# Patient Record
Sex: Male | Born: 1980 | Race: White | Hispanic: No | Marital: Married | Smoking: Current some day smoker
Health system: Southern US, Community
[De-identification: ages and names within clinical notes are randomized; demographics above are authoritative.]

---

## 2013-01-17 ENCOUNTER — Ambulatory Visit: Payer: Self-pay | Admitting: Family Medicine

## 2013-02-20 ENCOUNTER — Ambulatory Visit: Payer: Self-pay | Admitting: Family Medicine

## 2015-03-04 IMAGING — CR DG KNEE COMPLETE 4+V*R*
1 series · 4 of 4 positions shown · non-contrast
Comparison: none

REASON FOR EXAM: right knee pain
COMMENTS:

PROCEDURE:     MDR - MDR KNEE RT COMPLETE W/OBLIQUES  - January 17, 2013 [DATE]
RESULT:     Comparison: None.

[Series 1: ap · 0.17mm/px · 4 of 4 slices shown]
[im 1/4]
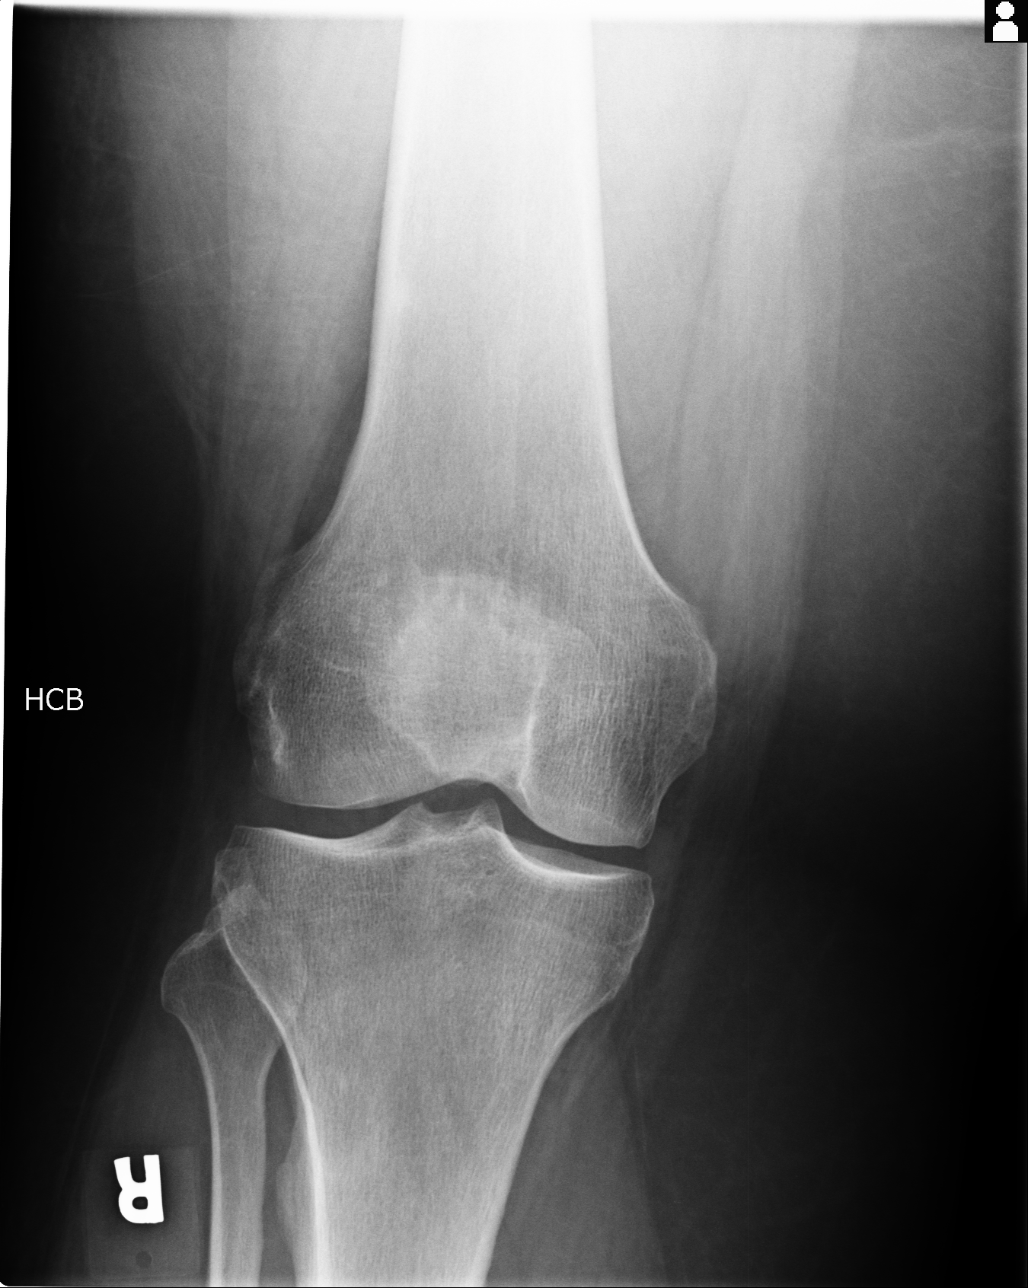
[im 2/4]
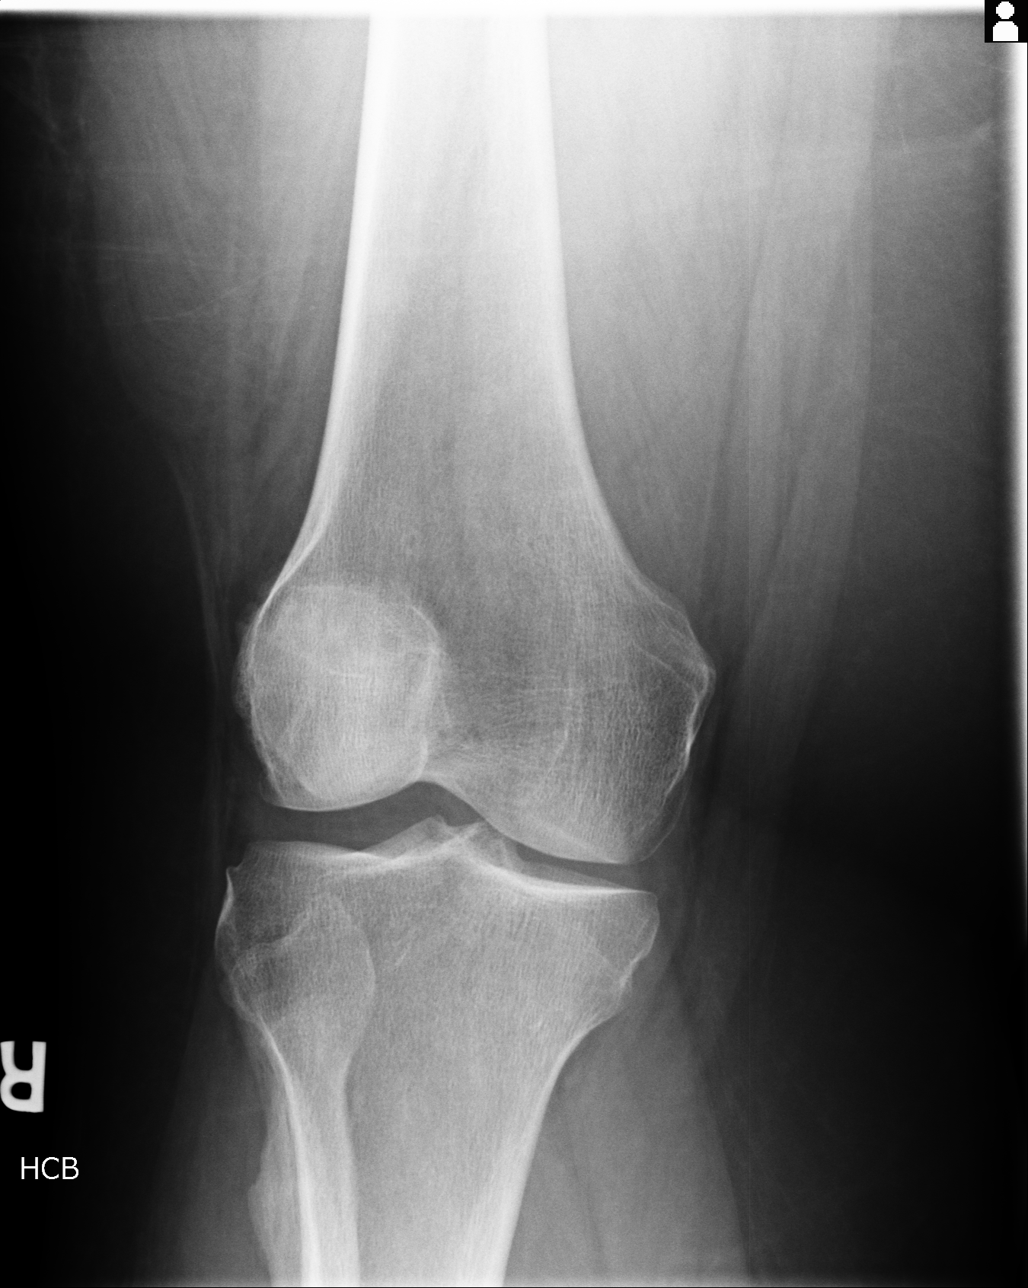
[im 3/4]
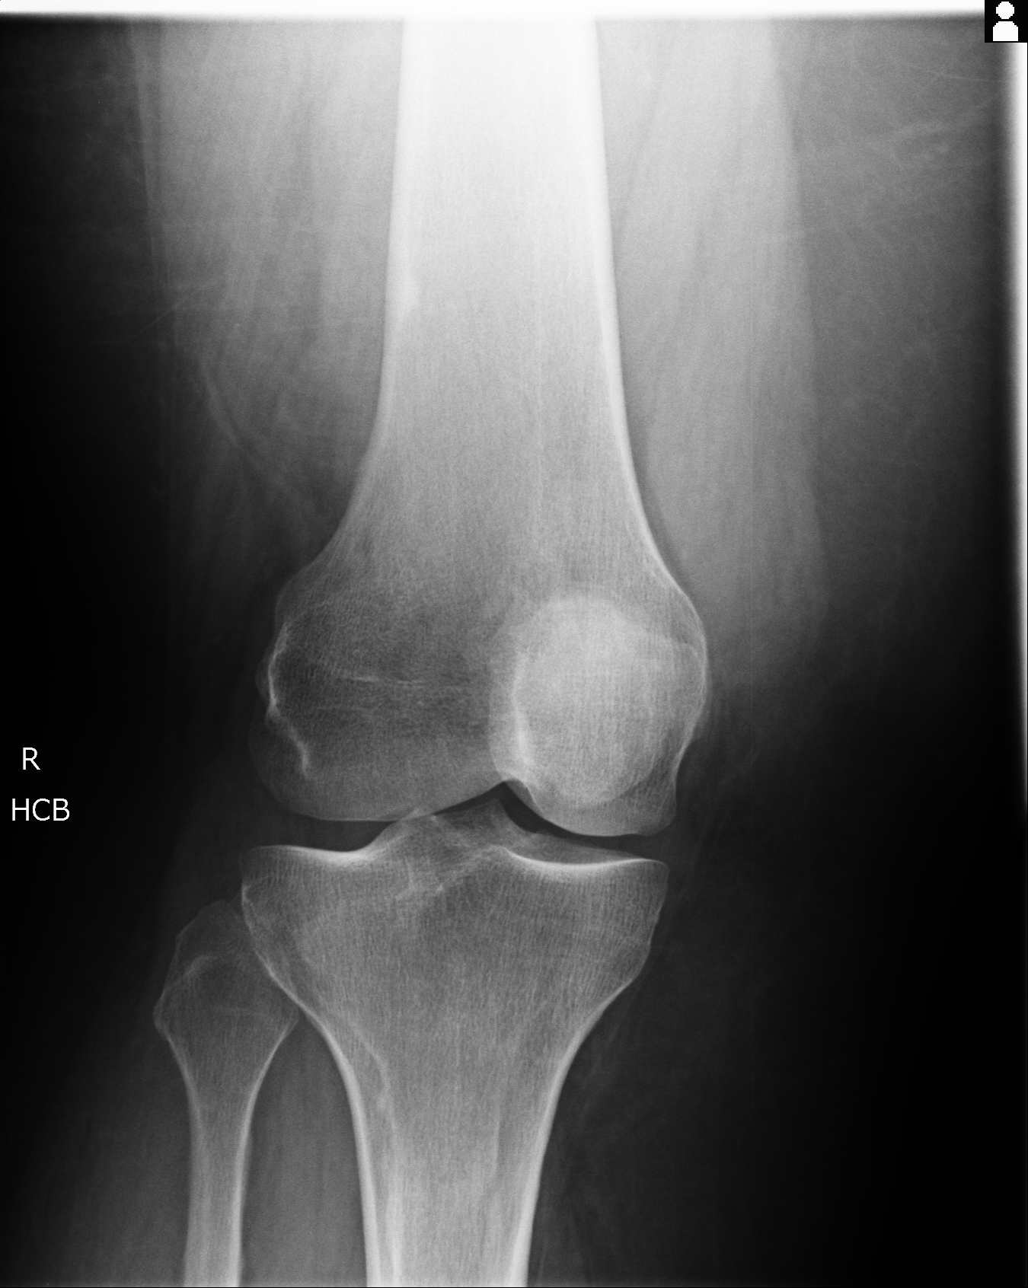
[im 4/4]
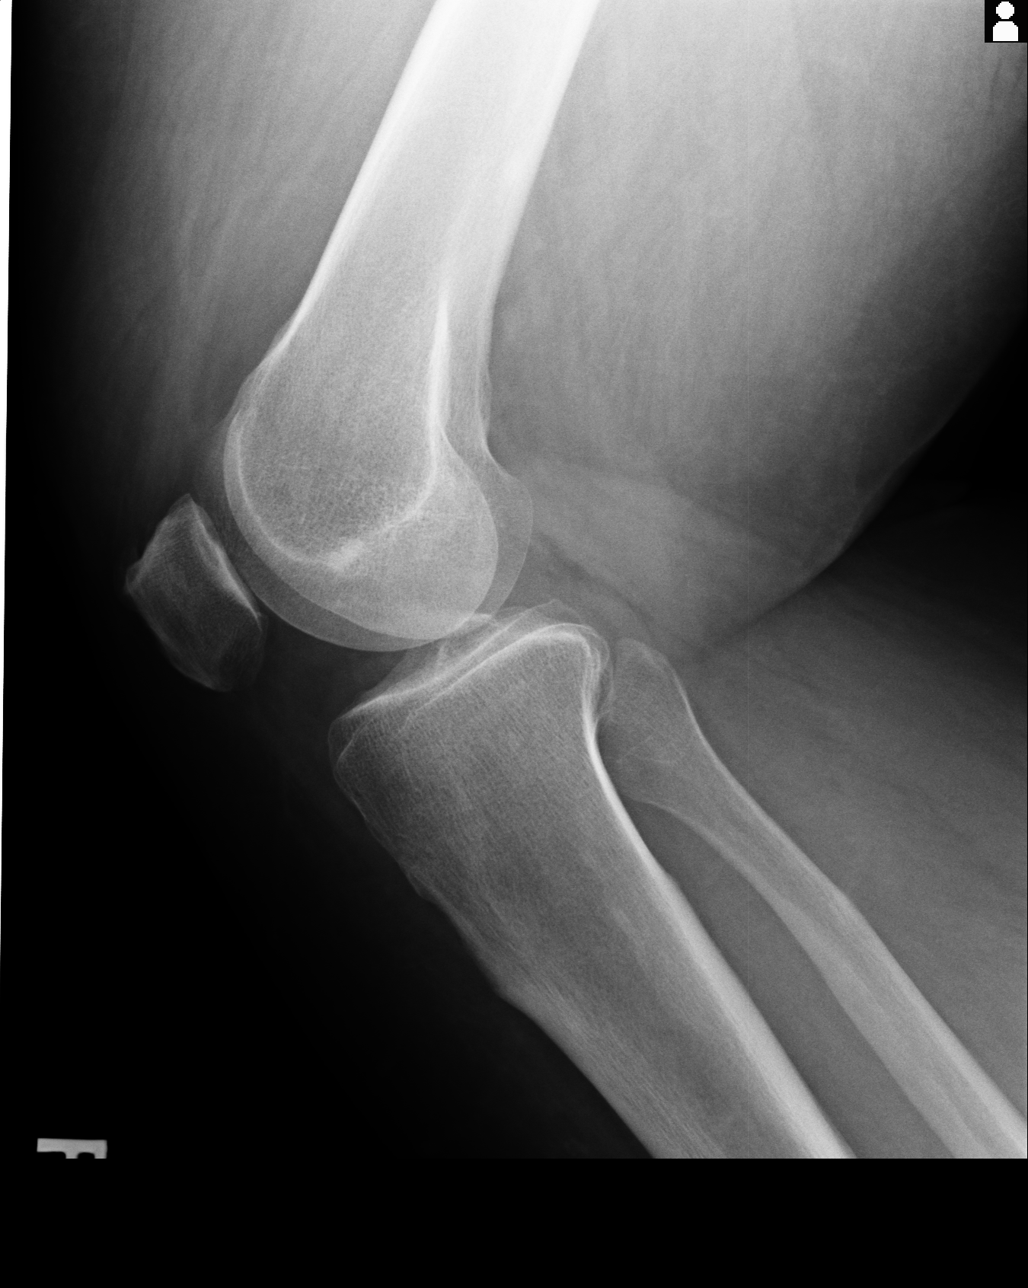

[4 of 4 positions shown; findings below may reference images not displayed]

FINDINGS: No acute fracture. The joint spaces are maintained. No significant effusion.
IMPRESSION: No acute findings.

[REDACTED]

## 2016-12-17 ENCOUNTER — Ambulatory Visit
Admission: EM | Admit: 2016-12-17 | Discharge: 2016-12-17 | Disposition: A | Discharge status: Home / Self Care | Payer: BLUE CROSS/BLUE SHIELD

## 2016-12-17 ENCOUNTER — Emergency Department: Payer: BLUE CROSS/BLUE SHIELD

## 2016-12-17 ENCOUNTER — Emergency Department
Admission: EM | Admit: 2016-12-17 | Discharge: 2016-12-17 | Disposition: A | Discharge status: Home / Self Care | Payer: BLUE CROSS/BLUE SHIELD | Attending: Emergency Medicine | Admitting: Emergency Medicine

## 2016-12-17 ENCOUNTER — Encounter: Payer: Self-pay | Admitting: Emergency Medicine

## 2016-12-17 DIAGNOSIS — R109 Unspecified abdominal pain: Secondary | ICD-10-CM | POA: Diagnosis present

## 2016-12-17 DIAGNOSIS — N200 Calculus of kidney: Secondary | ICD-10-CM | POA: Insufficient documentation

## 2016-12-17 DIAGNOSIS — F17290 Nicotine dependence, other tobacco product, uncomplicated: Secondary | ICD-10-CM | POA: Diagnosis not present

## 2016-12-17 DIAGNOSIS — R739 Hyperglycemia, unspecified: Secondary | ICD-10-CM | POA: Insufficient documentation

## 2016-12-17 LAB — URINALYSIS, COMPLETE (UACMP) WITH MICROSCOPIC
Bacteria, UA: NONE SEEN
Bilirubin Urine: NEGATIVE
Glucose, UA: NEGATIVE mg/dL
KETONES UR: NEGATIVE mg/dL
Leukocytes, UA: NEGATIVE
Nitrite: NEGATIVE
PH: 5 (ref 5.0–8.0)
Protein, ur: 100 mg/dL — AB
Specific Gravity, Urine: 1.026 (ref 1.005–1.030)

## 2016-12-17 LAB — BASIC METABOLIC PANEL
ANION GAP: 10 (ref 5–15)
BUN: 15 mg/dL (ref 6–20)
CALCIUM: 9.2 mg/dL (ref 8.9–10.3)
CO2: 24 mmol/L (ref 22–32)
Chloride: 104 mmol/L (ref 101–111)
Creatinine, Ser: 1 mg/dL (ref 0.61–1.24)
GFR calc Af Amer: >60 mL/min (ref >60)
GFR calc non Af Amer: >60 mL/min (ref >60)
Glucose, Bld: 220 mg/dL — ABNORMAL HIGH (ref 65–99)
POTASSIUM: 4.2 mmol/L (ref 3.5–5.1)
Sodium: 138 mmol/L (ref 135–145)

## 2016-12-17 LAB — CBC WITH DIFFERENTIAL/PLATELET
BASOS ABS: 0.1 10*3/uL (ref 0–0.1)
BASOS PCT: 1 %
Eosinophils Absolute: 0 10*3/uL (ref 0–0.7)
Eosinophils Relative: 0 %
HEMATOCRIT: 48.4 % (ref 40.0–52.0)
Hemoglobin: 16.2 g/dL (ref 13.0–18.0)
Lymphocytes Relative: 10 %
Lymphs Abs: 1.2 10*3/uL (ref 1.0–3.6)
MCH: 29.4 pg (ref 26.0–34.0)
MCHC: 33.5 g/dL (ref 32.0–36.0)
MCV: 87.8 fL (ref 80.0–100.0)
Monocytes Absolute: 0.7 10*3/uL (ref 0.2–1.0)
Monocytes Relative: 6 %
NEUTROS ABS: 9.9 10*3/uL — AB (ref 1.4–6.5)
Neutrophils Relative %: 83 %
Platelets: 185 10*3/uL (ref 150–440)
RBC: 5.51 MIL/uL (ref 4.40–5.90)
RDW: 13.9 % (ref 11.5–14.5)
WBC: 11.9 10*3/uL — ABNORMAL HIGH (ref 3.8–10.6)

## 2016-12-17 MED ORDER — ONDANSETRON HCL 4 MG PO TABS: 4.0000 mg | ORAL_TABLET | Freq: Three times a day (TID) | ORAL | 0 refills | Status: DC | PRN

## 2016-12-17 MED ORDER — KETOROLAC TROMETHAMINE 30 MG/ML IJ SOLN
30.0000 mg | Freq: Once | INTRAMUSCULAR | Status: AC
  Administered 2016-12-17: 30 mg via INTRAVENOUS

## 2016-12-17 MED ORDER — FENTANYL CITRATE (PF) 100 MCG/2ML IJ SOLN
50.0000 ug | Freq: Once | INTRAMUSCULAR | Status: AC
  Administered 2016-12-17: 50 ug via INTRAVENOUS
  Filled 2016-12-17: qty 2

## 2016-12-17 MED ORDER — ONDANSETRON HCL 4 MG/2ML IJ SOLN
4.0000 mg | Freq: Once | INTRAMUSCULAR | Status: AC
  Administered 2016-12-17: 4 mg via INTRAVENOUS
  Filled 2016-12-17: qty 2

## 2016-12-17 MED ORDER — HYDROMORPHONE HCL 1 MG/ML IJ SOLN
1.0000 mg | Freq: Once | INTRAMUSCULAR | Status: AC
  Administered 2016-12-17: 1 mg via INTRAVENOUS
  Filled 2016-12-17: qty 1

## 2016-12-17 MED ORDER — SODIUM CHLORIDE 0.9 % IV BOLUS (SEPSIS)
1000.0000 mL | Freq: Once | INTRAVENOUS | Status: AC
  Administered 2016-12-17: 1000 mL via INTRAVENOUS

## 2016-12-17 MED ORDER — TAMSULOSIN HCL 0.4 MG PO CAPS: 0.4000 mg | ORAL_CAPSULE | Freq: Every day | ORAL | 0 refills | Status: DC

## 2016-12-17 MED ORDER — KETOROLAC TROMETHAMINE 30 MG/ML IJ SOLN
INTRAMUSCULAR | Status: AC
  Administered 2016-12-17: 30 mg via INTRAVENOUS
  Filled 2016-12-17: qty 1

## 2016-12-17 MED ORDER — OXYCODONE-ACETAMINOPHEN 5-325 MG PO TABS: 1.0000 | ORAL_TABLET | Freq: Four times a day (QID) | ORAL | 0 refills | Status: DC | PRN

## 2016-12-17 MED ORDER — IBUPROFEN 200 MG PO TABS: 600.0000 mg | ORAL_TABLET | Freq: Four times a day (QID) | ORAL | 0 refills | Status: DC | PRN

## 2016-12-17 NOTE — ED Provider Notes (Addendum)
[facility] Emergency Department Provider Note  ____________________________________________   I have reviewed the triage vital signs and the nursing notes.   HISTORY  Chief Complaint Flank Pain    HPI Jesse Kelly. is a 36 y.o. male who presents today with sudden onset right flank pain. Patient states the pains been theresince around 11. He has not had a kidney stone before. He denies any other significant medical history. He has no numbness or weakness. States pain is excruciating and he states that it is a sharp pain that radiates to his groin waxes and wanes. Has had nausea and vomiting with the pain only. Sudden onset.     History reviewed. No pertinent past medical history.  There are no active problems to display for this patient.   History reviewed. No pertinent surgical history.  Prior to Admission medications   Not on File    Allergies Patient has no known allergies.  History reviewed. No pertinent family history.  Social History Social History  Substance Use Topics  . Smoking status: Current Some Day Smoker    Types: Cigars  . Smokeless tobacco: Never Used  . Alcohol use No    Review of Systems Constitutional: No fever/chills Eyes: No visual changes. ENT: No sore throat. No stiff neck no neck pain Cardiovascular: Denies chest pain. Respiratory: Denies shortness of breath. Gastrointestinal:   Positive vomiting.  No diarrhea.  No constipation. Genitourinary: Negative for dysuria. Musculoskeletal: Negative lower extremity swelling Skin: Negative for rash. Neurological: Negative for severe headaches, focal weakness or numbness. 10-point ROS otherwise negative.  ____________________________________________   PHYSICAL EXAM:  VITAL SIGNS: ED Triage Vitals  Enc Vitals Group     BP 12/17/16 1403 (!) 162/80     Pulse Rate 12/17/16 1403 (!) 101     Resp 12/17/16 1403 (!) 22     Temp 12/17/16 1403 98.6 F (37 C)      Temp Source 12/17/16 1403 Oral     SpO2 12/17/16 1403 96 %     Weight 12/17/16 1404 (!) 395 lb (179.2 kg)     Height 12/17/16 1404 5\' 11"  (1.803 m)     Head Circumference --      Peak Flow --      Pain Score 12/17/16 1403 10     Pain Loc --      Pain Edu? --      Excl. in [place]? --     Constitutional: Alert and oriented. He is not medically toxic but is very dramatically uncomfortable Eyes: Conjunctivae are normal. PERRL. EOMI. Head: Atraumatic. Nose: No congestion/rhinnorhea. Mouth/Throat: Mucous membranes are moist.  Oropharynx non-erythematous. Neck: No stridor.   Nontender with no meningismus Cardiovascular: Normal rate, regular rhythm. Grossly normal heart sounds.  Good peripheral circulation. Respiratory: Normal respiratory effort.  No retractions. Lungs CTAB. Abdominal: Soft and nontender. No distention. No guarding no rebound Back:  There is no focal tenderness or step off.  there is no midline tenderness there are no lesions noted. there is Positive right CVA tenderness Musculoskeletal: No lower extremity tenderness, no upper extremity tenderness. No joint effusions, no DVT signs strong distal pulses no edema Neurologic:  Normal speech and language. No gross focal neurologic deficits are appreciated.  Skin:  Skin is warm, dry and intact. No rash noted. Psychiatric: Mood and affect are anxious and upset. Speech and behavior are normal.  ____________________________________________   LABS (all labs ordered are listed, but only abnormal results are displayed)  Labs Reviewed  URINALYSIS, COMPLETE (UACMP) WITH MICROSCOPIC - Abnormal; Notable for the following:       Result Value   Color, Urine AMBER (*)    APPearance CLOUDY (*)    Hgb urine dipstick LARGE (*)    Protein, ur 100 (*)    Squamous Epithelial / LPF 0-5 (*)    All other components within normal limits  BASIC METABOLIC PANEL - Abnormal; Notable for the following:    Glucose, Bld 220 (*)    All other components  within normal limits  CBC WITH DIFFERENTIAL/PLATELET - Abnormal; Notable for the following:    WBC 11.9 (*)    Neutro Abs 9.9 (*)    All other components within normal limits   ____________________________________________  EKG  I personally interpreted any EKGs ordered by me or triage  ____________________________________________  RADIOLOGY  I reviewed any imaging ordered by me or triage that were performed during my shift and, if possible, patient and/or family made aware of any abnormal findings. ____________________________________________   PROCEDURES  Procedure(s) performed: None  Procedures  Critical Care performed: None  ____________________________________________   INITIAL IMPRESSION / ASSESSMENT AND PLAN / ED COURSE  Pertinent labs & imaging results that were available during my care of the patient were reviewed by me and considered in my medical decision making (see chart for details).  Patient very anxious and upset, although clinically he appears to be nontoxic. He does have evidence of kidney stone with hematuria. History of physical are very consistent with kidney stone. Patient received fentanyl and "it did not touch his pain" from the waiting room. I will therefore give him Toradol and Dilaudid as he is yelling in the room.  ----------------------------------------- 3:45 PM on 12/17/2016 -----------------------------------------  Patient very comfortable pain is nearly gone, we will recheck his vitals as I assume that will improve now that his pain is better. He is not vomiting. CT scan shows a small stone which is her pastor in the process of passing. Mild hydronephrosis is noted. Nothing to suggest urinary tract infection. Glucose is not dangerously elevated But it is above normal we have made the patient and his family aware that he needs to have that rechecked as an outpatient.  ----------------------------------------- 4:03 PM on  12/17/2016 ----------------------------------------- Patient very comfortable this time, again counseled him about weight loss, his elevated sugar and the need to follow-up with his primary care doctor, we will send him home with the usual medications return precautions and follow-up given and understood.       ____________________________________________   FINAL CLINICAL IMPRESSION(S) / ED DIAGNOSES  Final diagnoses:  None      This chart was dictated using voice recognition software.  Despite best efforts to proofread,  errors can occur which can change meaning.      Schuyler Amor, MD 12/17/16 [place], MD 12/17/16 [phone]

## 2016-12-17 NOTE — ED Triage Notes (Signed)
This RN initially called out to car by patient's father, pt was initially found on the ground outside of the car, patient's father states patient laid down on the ground when he was attempting to get out of the car. Pt c/o R flank pain at this time, states at some points pain is a 7/10 then "jumps up to a 20." Pt is noted to be nauseated and actively vomiting in triage.

## 2016-12-17 NOTE — ED Notes (Signed)
Pt taken to CT by Ebony Hail, Ct tech

## 2019-02-01 IMAGING — CT CT RENAL STONE PROTOCOL
2 of 5 series · 16 of 46 positions shown, 18 images · non-contrast
Comparison: None.

CLINICAL DATA: Acute right flank pain.  Difficulty urinating.

EXAM:
CT ABDOMEN AND PELVIS WITHOUT CONTRAST
TECHNIQUE: Multidetector CT imaging of the abdomen and pelvis was performed
following the standard protocol without IV contrast.

[Series 2: stone full standard · axial · 0.89mm/px · z∈[-556,-76]mm · 13 of 106 slices shown, 15 images]
[im 5/106  soft-tissue]
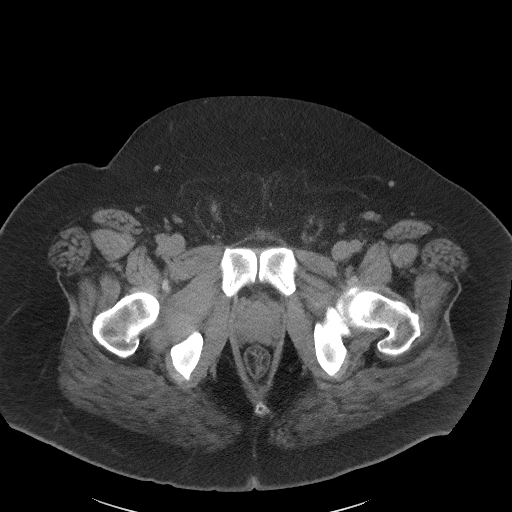
[im 5/106  bone]
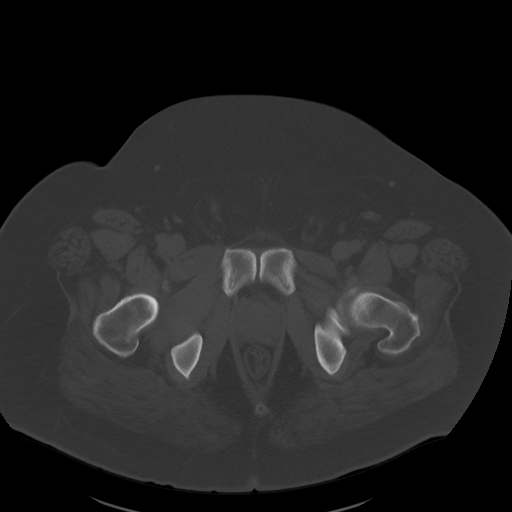
[im 15/106  soft-tissue]
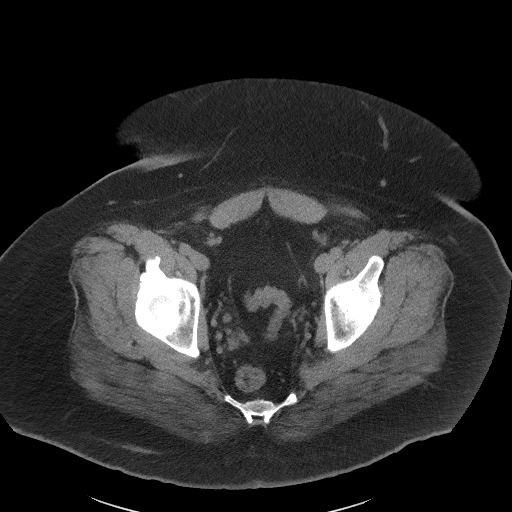
[im 24/106  soft-tissue]
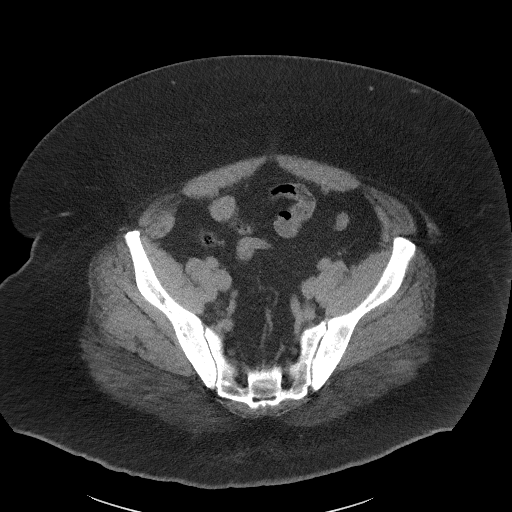
[im 29/106  soft-tissue]
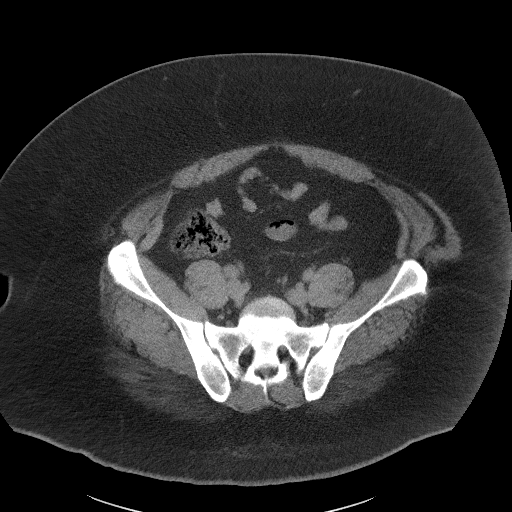
[im 39/106  soft-tissue]
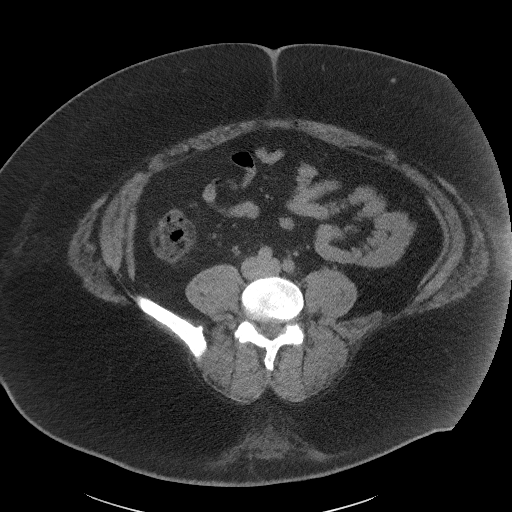
[im 43/106  soft-tissue]
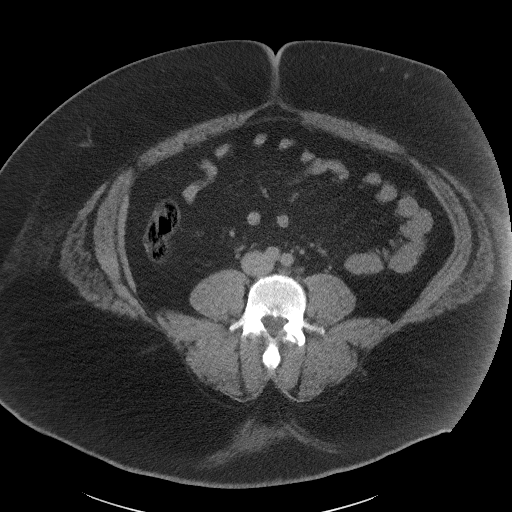
[im 53/106  soft-tissue]
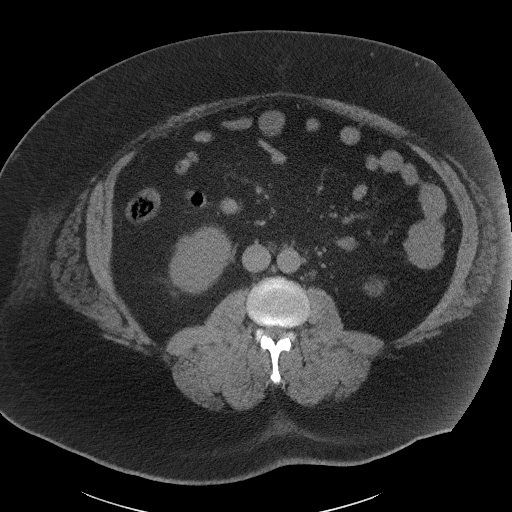
[im 63/106  soft-tissue]
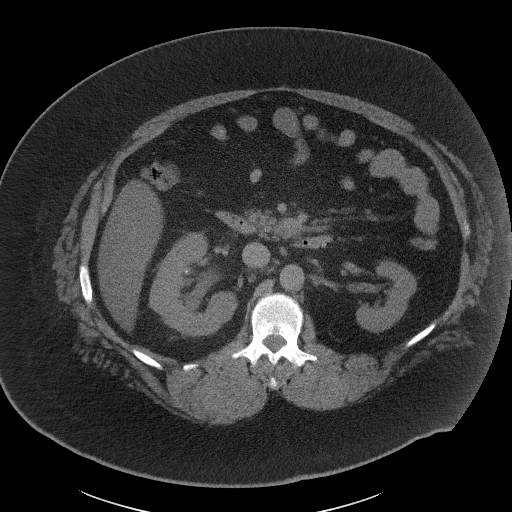
[im 67/106  soft-tissue]
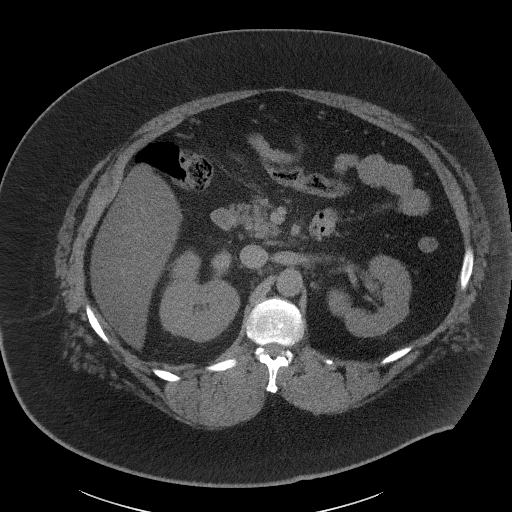
[im 67/106  bone]
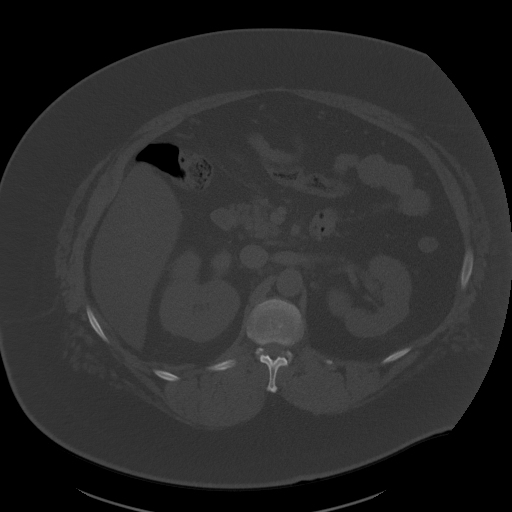
[im 77/106  soft-tissue]
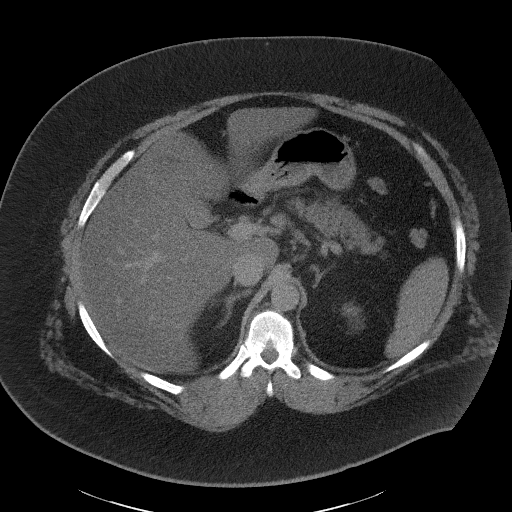
[im 82/106  soft-tissue]
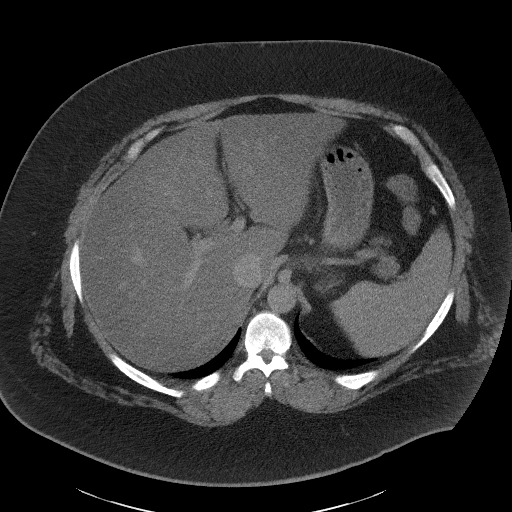
[im 91/106  soft-tissue]
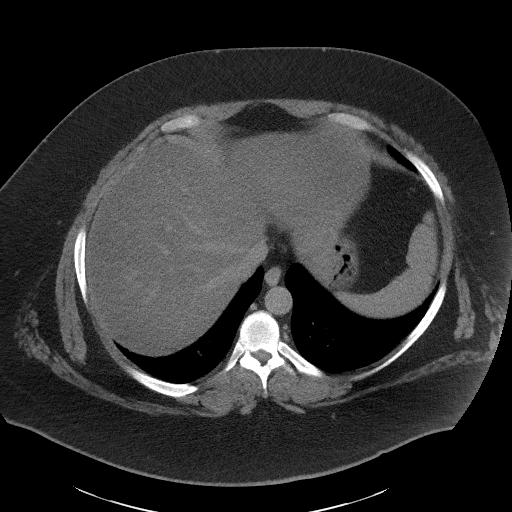
[im 101/106  soft-tissue]
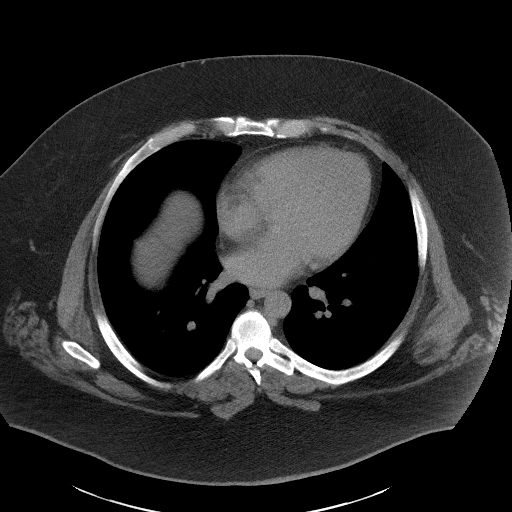

[Series 7: coronal · coronal · 0.89mm/px · 3 of 166 slices shown]
[im 56/166  soft-tissue]
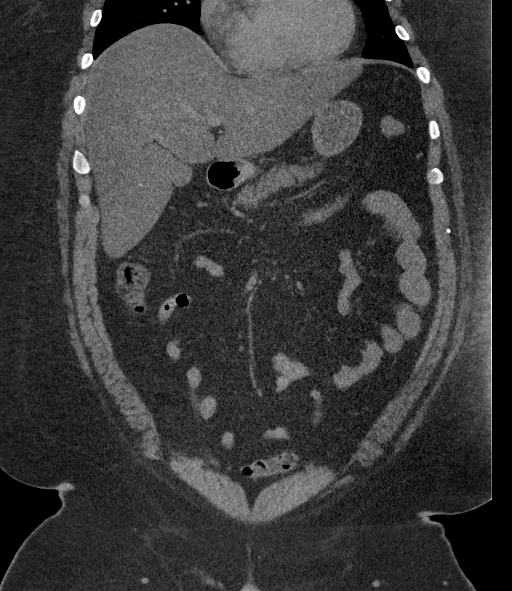
[im 74/166  soft-tissue]
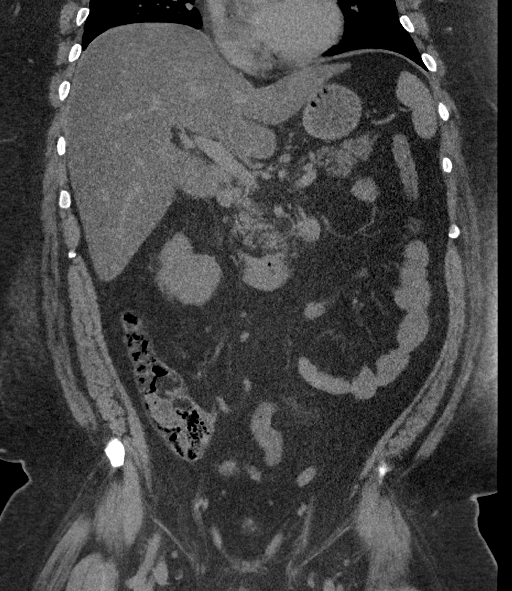
[im 92/166  soft-tissue]
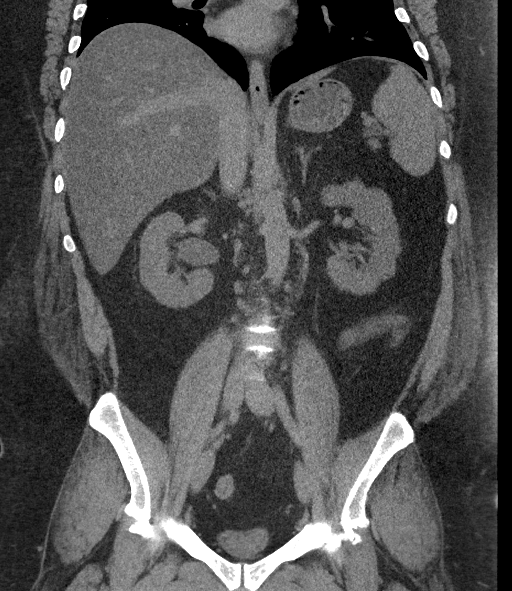

[16 of 46 positions shown; findings below may reference images not displayed]

FINDINGS: Lower chest: No acute abnormality.

Hepatobiliary: Hepatic steatosis. The gallbladder is normally
distended, however demonstrates unusually hyperdense appearance.

Pancreas: Unremarkable. No pancreatic ductal dilatation or
surrounding inflammatory changes.

Spleen: Normal in size without focal abnormality.

Adrenals/Urinary Tract: Normal left kidney and bilateral adrenal
glands. Normal left ureter. There is right perirenal fat stranding,
mild right hydronephrosis and hydroureter, likely caused by a 3 mm
calculus which is seen in the right trigone region of the urinary
bladder. It is not certain whether it is located within the
intravesicular portion of the right ureter or it is free within the
urinary bladder. A tiny 1-2 mm nonobstructive calculus is seen in
the superior pole of the right kidney.

Stomach/Bowel: Stomach is within normal limits. Appendix appears
normal. No evidence of bowel wall thickening, distention, or
inflammatory changes.

Vascular/Lymphatic: No significant vascular findings are present. No
enlarged abdominal or pelvic lymph nodes.

Reproductive: Prostate is unremarkable.

Other: No abdominal wall hernia or abnormality. No abdominopelvic
ascites.

Musculoskeletal: No acute or significant osseous findings.
IMPRESSION: Mild right hydroureter and hydronephrosis with perirenal
inflammatory changes, likely caused by 3 mm calculus which is seen
within the right trigone region of the urinary bladder. It is not
certain whether the calculus is located within the intravesicular
portion of the right ureter or it is free within the urinary
bladder.

Tiny 1-2 mm nonobstructive calculus in the superior pole of the
right kidney.

Hepatic steatosis.

Unusually high attenuation of the contents of the otherwise normally
distended gallbladder. Please correlate clinically.

## 2019-12-25 ENCOUNTER — Other Ambulatory Visit: Payer: Self-pay | Admitting: Family Medicine

## 2020-01-01 ENCOUNTER — Other Ambulatory Visit: Payer: Self-pay | Admitting: Family Medicine

## 2020-01-01 DIAGNOSIS — D171 Benign lipomatous neoplasm of skin and subcutaneous tissue of trunk: Secondary | ICD-10-CM

## 2020-01-23 ENCOUNTER — Ambulatory Visit: Admission: RE | Admit: 2020-01-23 | Payer: Self-pay | Source: Ambulatory Visit

## 2024-03-24 NOTE — Progress Notes (Signed)
 Chief Complaint:   Chief Complaint  Patient presents with   Follow-up    Follow up    Subjective:  Jesse Kelly is a 43 y.o. male in today for follow up.    DM.  He is on Metformin 1000 mg twice a day and Glipizide once a day.  Had been on trulicity weekly but hasn't had any for at least 2-3 months.  He had lost weight on the trulicity but weight is stable in recent months.  BS's have run 140-220 in the AM in recent weeks.  No increased thirst and urination.  Last A1c was 8.5 on 03/17/24, down from 10.5 a few months ago. HTN.  He is on Benicar 40 mg daily.  BP has run around 130-140's/80-90's.  Says when he is not stressed, BP is okay but admits that he is stressed/anxious a lot.  He is not physically active.  No chest pain or palpitations.  He has a history of SOB, which has improved since starting Singulair. 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  He has anxiety frequently as mentioned above.  Hyperlipidemia.  Is on Atorvastatin 20 mg daily.  Lipids on 11/06/23 showed TC of 273, HDL 51, LDL 155 and TG 333 but presumably wasn't taking medication prior to those labs. No side effects on the medication.  Liver enzymes are normal.  Past Medical History:  Diagnosis Date   Arthritis    Back pain    Degenerative joint disease of knee, right    DM (diabetes mellitus) (CMS/HHS-HCC)    Dyshidrotic eczema    Hepatic steatosis 12/24/2019   Kidney stones    Morbid obesity (CMS/HHS-HCC)    Right knee pain    Tobacco dependence 07/27/2014   No past surgical history on file. Family History  Problem Relation Name Age of Onset   COPD Mother     Sleep apnea Mother     Diabetes Mother     High blood pressure (Hypertension) Father     Prostate cancer Father     Gout Father     Hyperlipidemia (Elevated cholesterol) Father     Deep vein thrombosis (DVT or abnormal blood clot formation) Maternal Grandmother      Alzheimer's disease Maternal Grandfather     Colon polyps Maternal Grandfather     Colon cancer Maternal Grandfather     Osteoarthritis Paternal Grandmother     Colon cancer Paternal Grandfather     Social History   Socioeconomic History   Marital status: Married   Number of children: 0   Years of education: 14  Tobacco Use   Smoking status: Some Days    Types: Cigars   Smokeless tobacco: Never   Tobacco comments:    1 cigar 1 day a week  Vaping Use   Vaping status: Never Used  Substance and Sexual Activity   Alcohol use: Not Currently    Alcohol/week: 1.0 - 2.0 standard drink of alcohol    Types: 1 - 2 Standard drinks or equivalent per week   Drug use: No   Sexual activity: Yes    Partners: Female  Social History Narrative   Originally from [place] , married without children. He works as a Engineer, materials. He keeps his gun locked ina safe place. He does not wear seatbelts 100 percent.  No weekly exercise. No fast food and unknown amount of red meat weekly.   Social Drivers of Dispensing Optician  Resource Strain: Low Risk  (07/30/2023)   Overall Financial Resource Strain (CARDIA)    Difficulty of Paying Living Expenses: Not hard at all  Food Insecurity: No Food Insecurity (07/30/2023)   Hunger Vital Sign    Worried About Running Out of Food in the Last Year: Never true    Ran Out of Food in the Last Year: Never true  Transportation Needs: No Transportation Needs (07/30/2023)   PRAPARE - Administrator, Civil Service (Medical): No    Lack of Transportation (Non-Medical): No  Housing Stability: Unknown (03/24/2024)   Housing Stability Vital Sign    Number of Times Moved in the Last Year: 0    Homeless in the Last Year: No   Outpatient Medications Prior to Visit  Medication Sig Dispense Refill   albuterol MDI, PROVENTIL, VENTOLIN, PROAIR, HFA 90 mcg/actuation inhaler Inhale 2 inhalations into the lungs 4 (four) times daily 3 each 1    armodafiniL (NUVIGIL) 150 mg tablet Take 1 tablet (150 mg total) by mouth once daily 90 tablet 1   atorvastatin (LIPITOR) 20 MG tablet Take 1 tablet (20 mg total) by mouth at bedtime 90 tablet 1   dulaglutide (TRULICITY) 3 mg/0.5 mL subcutaneous pen injector Inject 0.5 mLs (3 mg total) subcutaneously every 7 (seven) days 2 mL 0   fluticasone-umeclidinium-vilanterol (TRELEGY ELLIPTA) 100-62.5-25 mcg inhaler Inhale 1 Puff into the lungs once daily 1 each 6   glipiZIDE (GLUCOTROL XL) 5 MG XL tablet Take 1 tablet (5 mg total) by mouth once daily 30 tablet 2   metFORMIN (GLUCOPHAGE) 1000 MG tablet TAKE 1 TABLET BY MOUTH TWICE A DAY WITH MEALS 60 tablet 2   montelukast (SINGULAIR) 10 mg tablet Take 1 tablet (10 mg total) by mouth at bedtime 90 tablet 1   olmesartan (BENICAR) 40 MG tablet Take 1 tablet (40 mg total) by mouth once daily 90 tablet 1   No facility-administered medications prior to visit.   Allergies  Allergen Reactions   Iodinated Contrast Media Hives   Dog Dander Other (See Comments)    Wheezing, sob, and  cough.   Dog Epithelium Allergenic Extract Other (See Comments)    Wheezing, sob, and  cough.    Objective:   Vitals:   03/24/24 1524  BP: 136/82  Pulse: 88  SpO2: 98%  Weight: (!) 151.8 kg (334 lb 9.6 oz)  Height: 177.8 cm (5' 10)  PainSc: 0-No pain   Body mass index is 48.01 kg/m.   GENERAL:  Patient is alert and oriented, in no acute distress.  HEENT:   Conjunctivae are noninjected.  Oropharynx:  Moist mucous membranes.  No erythema  or lesions. RESPIRATORY:  Normal inspiratory and expiratory effort.  Lungs are clear to auscultation bilaterally. CARDIOVASCULAR:  Regular rate and rhythm, S1, S2, without murmur, rub, or gallop. ABDOMEN:  Obese, nontender and non-distended.   EXTREMITIES:  +2 pulses bilaterally.  No edema.  Assessment/Plan:  1.  DM.  Restart Trulicity at 1.5 mg weekly.  Continue on current oral meds and work on weight loss and increased  exercise.  He will see if he can get a CGM.  Check A1c and Met B in 3 months. 2.  HTN.  Stay on Olmesartan daily.  Check BP at home and let me know if consistently >130/80. 3.  Hyperlipidemia.  Stay on Atorvastatin 20 mg daily.  Check lipids and liver next labs. 4.  Shift worker syndrome.  Stay on Nuvigil 150 mg daily.   5.  Anxiety, stress.  Consider medication if it significantly is impacting his quality of life. 6.  Follow up in 3 months for recheck, sooner as needed for acute concerns.   Alm Na, MD, PhD

## 2024-08-07 ENCOUNTER — Other Ambulatory Visit: Payer: Self-pay

## 2024-08-07 ENCOUNTER — Telehealth (HOSPITAL_COMMUNITY): Payer: Self-pay | Admitting: Pharmacy Technician

## 2024-08-07 ENCOUNTER — Other Ambulatory Visit (HOSPITAL_COMMUNITY): Payer: Self-pay

## 2024-08-07 ENCOUNTER — Observation Stay
Admission: EM | Admit: 2024-08-07 | Discharge: 2024-08-08 | Disposition: A | Attending: Internal Medicine | Admitting: Internal Medicine

## 2024-08-07 DIAGNOSIS — F17210 Nicotine dependence, cigarettes, uncomplicated: Secondary | ICD-10-CM | POA: Insufficient documentation

## 2024-08-07 DIAGNOSIS — I10 Essential (primary) hypertension: Secondary | ICD-10-CM | POA: Diagnosis not present

## 2024-08-07 DIAGNOSIS — E1110 Type 2 diabetes mellitus with ketoacidosis without coma: Principal | ICD-10-CM

## 2024-08-07 DIAGNOSIS — Z794 Long term (current) use of insulin: Secondary | ICD-10-CM | POA: Insufficient documentation

## 2024-08-07 DIAGNOSIS — E785 Hyperlipidemia, unspecified: Secondary | ICD-10-CM | POA: Insufficient documentation

## 2024-08-07 DIAGNOSIS — Z79899 Other long term (current) drug therapy: Secondary | ICD-10-CM | POA: Insufficient documentation

## 2024-08-07 DIAGNOSIS — E0810 Diabetes mellitus due to underlying condition with ketoacidosis without coma: Secondary | ICD-10-CM

## 2024-08-07 DIAGNOSIS — E1165 Type 2 diabetes mellitus with hyperglycemia: Principal | ICD-10-CM

## 2024-08-07 DIAGNOSIS — R739 Hyperglycemia, unspecified: Secondary | ICD-10-CM | POA: Diagnosis present

## 2024-08-07 DIAGNOSIS — Z6841 Body Mass Index (BMI) 40.0 and over, adult: Secondary | ICD-10-CM | POA: Diagnosis not present

## 2024-08-07 DIAGNOSIS — N179 Acute kidney failure, unspecified: Secondary | ICD-10-CM | POA: Diagnosis not present

## 2024-08-07 DIAGNOSIS — E66813 Obesity, class 3: Secondary | ICD-10-CM

## 2024-08-07 LAB — BASIC METABOLIC PANEL WITH GFR
Anion gap: 19 — ABNORMAL HIGH (ref 5–15)
Anion gap: 13 (ref 5–15)
Anion gap: 13 (ref 5–15)
BUN: 35 mg/dL — ABNORMAL HIGH (ref 6–20)
BUN: 32 mg/dL — ABNORMAL HIGH (ref 6–20)
BUN: 28 mg/dL — ABNORMAL HIGH (ref 6–20)
CO2: 20 mmol/L — ABNORMAL LOW (ref 22–32)
CO2: 26 mmol/L (ref 22–32)
CO2: 25 mmol/L (ref 22–32)
Calcium: 9.6 mg/dL (ref 8.9–10.3)
Calcium: 8.8 mg/dL — ABNORMAL LOW (ref 8.9–10.3)
Calcium: 9 mg/dL (ref 8.9–10.3)
Chloride: 96 mmol/L — ABNORMAL LOW (ref 98–111)
Chloride: 100 mmol/L (ref 98–111)
Chloride: 101 mmol/L (ref 98–111)
Creatinine, Ser: 2.34 mg/dL — ABNORMAL HIGH (ref 0.61–1.24)
Creatinine, Ser: 1.98 mg/dL — ABNORMAL HIGH (ref 0.61–1.24)
Creatinine, Ser: 1.69 mg/dL — ABNORMAL HIGH (ref 0.61–1.24)
GFR, Estimated: 34 mL/min — ABNORMAL LOW (ref >60)
GFR, Estimated: 42 mL/min — ABNORMAL LOW (ref >60)
GFR, Estimated: 50 mL/min — ABNORMAL LOW (ref >60)
Glucose, Bld: 355 mg/dL — ABNORMAL HIGH (ref 70–99)
Glucose, Bld: 236 mg/dL — ABNORMAL HIGH (ref 70–99)
Glucose, Bld: 131 mg/dL — ABNORMAL HIGH (ref 70–99)
Potassium: 3.7 mmol/L (ref 3.5–5.1)
Potassium: 3.5 mmol/L (ref 3.5–5.1)
Potassium: 3.3 mmol/L — ABNORMAL LOW (ref 3.5–5.1)
Sodium: 135 mmol/L (ref 135–145)
Sodium: 138 mmol/L (ref 135–145)
Sodium: 139 mmol/L (ref 135–145)

## 2024-08-07 LAB — CBG MONITORING, ED
Glucose-Capillary: 357 mg/dL — ABNORMAL HIGH (ref 70–99)
Glucose-Capillary: 326 mg/dL — ABNORMAL HIGH (ref 70–99)
Glucose-Capillary: 282 mg/dL — ABNORMAL HIGH (ref 70–99)
Glucose-Capillary: 246 mg/dL — ABNORMAL HIGH (ref 70–99)
Glucose-Capillary: 172 mg/dL — ABNORMAL HIGH (ref 70–99)
Glucose-Capillary: 139 mg/dL — ABNORMAL HIGH (ref 70–99)
Glucose-Capillary: 142 mg/dL — ABNORMAL HIGH (ref 70–99)
Glucose-Capillary: 133 mg/dL — ABNORMAL HIGH (ref 70–99)
Glucose-Capillary: 171 mg/dL — ABNORMAL HIGH (ref 70–99)
Glucose-Capillary: 163 mg/dL — ABNORMAL HIGH (ref 70–99)

## 2024-08-07 LAB — URINALYSIS, ROUTINE W REFLEX MICROSCOPIC
Bilirubin Urine: NEGATIVE
Glucose, UA: >=500 mg/dL — AB
Ketones, ur: 5 mg/dL — AB
Leukocytes,Ua: NEGATIVE
Nitrite: NEGATIVE
Protein, ur: 30 mg/dL — AB
Specific Gravity, Urine: 1.009 (ref 1.005–1.030)
pH: 5 (ref 5.0–8.0)

## 2024-08-07 LAB — OSMOLALITY: Osmolality: 305 mosm/kg — ABNORMAL HIGH (ref 275–295)

## 2024-08-07 LAB — BLOOD GAS, VENOUS
Acid-Base Excess: 5.5 mmol/L — ABNORMAL HIGH (ref 0.0–2.0)
Bicarbonate: 32.4 mmol/L — ABNORMAL HIGH (ref 20.0–28.0)
O2 Saturation: 36.8 %
Patient temperature: 37
pCO2, Ven: 56 mmHg (ref 44–60)
pH, Ven: 7.37 (ref 7.25–7.43)

## 2024-08-07 LAB — CBC
HCT: 50.5 % (ref 39.0–52.0)
Hemoglobin: 17.6 g/dL — ABNORMAL HIGH (ref 13.0–17.0)
MCH: 29.5 pg (ref 26.0–34.0)
MCHC: 34.9 g/dL (ref 30.0–36.0)
MCV: 84.7 fL (ref 80.0–100.0)
Platelets: 176 K/uL (ref 150–400)
RBC: 5.96 MIL/uL — ABNORMAL HIGH (ref 4.22–5.81)
RDW: 12.8 % (ref 11.5–15.5)
WBC: 9.7 K/uL (ref 4.0–10.5)
nRBC: 0 % (ref 0.0–0.2)

## 2024-08-07 LAB — BETA-HYDROXYBUTYRIC ACID
Beta-Hydroxybutyric Acid: 1.35 mmol/L — ABNORMAL HIGH (ref 0.05–0.27)
Beta-Hydroxybutyric Acid: 0.1 mmol/L (ref 0.05–0.27)

## 2024-08-07 LAB — TROPONIN T, HIGH SENSITIVITY: Troponin T High Sensitivity: <15 ng/L (ref 0–19)

## 2024-08-07 MED ORDER — ENOXAPARIN SODIUM 80 MG/0.8ML IJ SOSY
0.5000 mg/kg | PREFILLED_SYRINGE | INTRAMUSCULAR | Status: DC
  Filled 2024-08-07 (×2): qty 0.75

## 2024-08-07 MED ORDER — ACETAMINOPHEN 325 MG PO TABS: 650.0000 mg | ORAL_TABLET | Freq: Four times a day (QID) | ORAL | Status: DC | PRN

## 2024-08-07 MED ORDER — INSULIN GLARGINE 100 UNIT/ML ~~LOC~~ SOLN
10.0000 [IU] | Freq: Every day | SUBCUTANEOUS | Status: DC
  Administered 2024-08-07: 15:00:00 10 [IU] via SUBCUTANEOUS
  Filled 2024-08-07 (×2): qty 0.1

## 2024-08-07 MED ORDER — ONDANSETRON HCL 4 MG/2ML IJ SOLN: 4.0000 mg | Freq: Four times a day (QID) | INTRAMUSCULAR | Status: DC | PRN

## 2024-08-07 MED ORDER — ACETAMINOPHEN 650 MG RE SUPP: 650.0000 mg | Freq: Four times a day (QID) | RECTAL | Status: DC | PRN

## 2024-08-07 MED ORDER — SODIUM CHLORIDE 0.9 % IV BOLUS
1500.0000 mL | Freq: Once | INTRAVENOUS | Status: AC
  Administered 2024-08-07: 10:00:00 1500 mL via INTRAVENOUS

## 2024-08-07 MED ORDER — NICOTINE 14 MG/24HR TD PT24
14.0000 mg | MEDICATED_PATCH | Freq: Every day | TRANSDERMAL | Status: DC
  Filled 2024-08-07: qty 1

## 2024-08-07 MED ORDER — DEXTROSE IN LACTATED RINGERS 5 % IV SOLN: INTRAVENOUS | Status: DC

## 2024-08-07 MED ORDER — HYDRALAZINE HCL 20 MG/ML IJ SOLN: 5.0000 mg | Freq: Four times a day (QID) | INTRAMUSCULAR | Status: DC | PRN

## 2024-08-07 MED ORDER — MELATONIN 5 MG PO TABS: 5.0000 mg | ORAL_TABLET | Freq: Every evening | ORAL | Status: DC | PRN

## 2024-08-07 MED ORDER — ALUM & MAG HYDROXIDE-SIMETH 200-200-20 MG/5ML PO SUSP: 30.0000 mL | Freq: Four times a day (QID) | ORAL | Status: DC | PRN

## 2024-08-07 MED ORDER — INSULIN REGULAR(HUMAN) IN NACL 100-0.9 UT/100ML-% IV SOLN
INTRAVENOUS | Status: DC
  Administered 2024-08-07: 12:00:00 12 [IU]/h via INTRAVENOUS
  Administered 2024-08-07: 14:00:00 2.8 [IU]/h via INTRAVENOUS
  Filled 2024-08-07: qty 100

## 2024-08-07 MED ORDER — INSULIN ASPART 100 UNIT/ML IJ SOLN: 0.0000 [IU] | Freq: Every day | INTRAMUSCULAR | Status: DC

## 2024-08-07 MED ORDER — DEXTROSE 50 % IV SOLN: 0.0000 mL | INTRAVENOUS | Status: DC | PRN

## 2024-08-07 MED ORDER — POTASSIUM CHLORIDE 10 MEQ/100ML IV SOLN
10.0000 meq | INTRAVENOUS | Status: AC
  Administered 2024-08-07 (×2): 10 meq via INTRAVENOUS
  Filled 2024-08-07: qty 100

## 2024-08-07 MED ORDER — ONDANSETRON HCL 4 MG PO TABS: 4.0000 mg | ORAL_TABLET | Freq: Four times a day (QID) | ORAL | Status: DC | PRN

## 2024-08-07 MED ORDER — LACTATED RINGERS IV BOLUS
20.0000 mL/kg | Freq: Once | INTRAVENOUS | Status: AC
  Administered 2024-08-07: 12:00:00 2994 mL via INTRAVENOUS

## 2024-08-07 MED ORDER — ALUM & MAG HYDROXIDE-SIMETH 200-200-20 MG/5ML PO SUSP
30.0000 mL | Freq: Once | ORAL | Status: AC
  Administered 2024-08-07: 16:00:00 30 mL via ORAL
  Filled 2024-08-07: qty 30

## 2024-08-07 MED ORDER — INSULIN ASPART 100 UNIT/ML IJ SOLN
0.0000 [IU] | Freq: Three times a day (TID) | INTRAMUSCULAR | Status: DC
  Administered 2024-08-07: 17:00:00 1 [IU] via SUBCUTANEOUS
  Administered 2024-08-08: 2 [IU] via SUBCUTANEOUS
  Filled 2024-08-07: qty 1
  Filled 2024-08-07: qty 2

## 2024-08-07 MED ORDER — ENOXAPARIN SODIUM 40 MG/0.4ML IJ SOSY: 40.0000 mg | PREFILLED_SYRINGE | INTRAMUSCULAR | Status: DC

## 2024-08-07 MED ORDER — LACTATED RINGERS IV SOLN: INTRAVENOUS | Status: DC

## 2024-08-07 MED ORDER — POLYETHYLENE GLYCOL 3350 17 G PO PACK: 17.0000 g | PACK | Freq: Every day | ORAL | Status: DC | PRN

## 2024-08-07 NOTE — Inpatient Diabetes Management (Addendum)
 Inpatient Diabetes Program Recommendations  AACE/ADA: New Consensus Statement on Inpatient Glycemic Control   Target Ranges:  Prepandial:   less than 140 mg/dL      Peak postprandial:   less than 180 mg/dL (1-2 hours)      Critically ill patients:  140 - 180 mg/dL    Latest Reference Range & Units 08/07/24 08:31 08/07/24 09:51 08/07/24 11:21 08/07/24 12:27  Glucose-Capillary 70 - 99 mg/dL 642 (H) 673 (H) 717 (H) 246 (H)    Latest Reference Range & Units 08/07/24 08:26  Beta-Hydroxybutyric Acid 0.05 - 0.27 mmol/L 1.35 (H)    Latest Reference Range & Units 08/07/24 08:26 08/07/24 12:07  CO2 22 - 32 mmol/L 20 (L) 26  Glucose 70 - 99 mg/dL 644 (H) 763 (H)  BUN 6 - 20 mg/dL 35 (H) 32 (H)  Creatinine 0.61 - 1.24 mg/dL 7.65 (H) 8.01 (H)  Calcium 8.9 - 10.3 mg/dL 9.6 8.8 (L)  Anion gap 5 - 15  19 (H) 13    Recommendation:   Outpatient DM: Per outpatient TOC pharmacy check, Basaglar  and Novolog  insulin  pens are preferred and Basaglar  Pen copay is $25.00, novolog  Flexpen copay is $25.00, and Dexcom copay is $35.00.  At discharge if patient is discharged new to insulin , he will need Rx for Basaglar  pens (#[id number]), Novolog  pens (#[id number]), and insulin  pen needles [phone]).  NOTE: Spoke with patient at bedside regarding DM and insulin . Patient states that he has been taking Glipizide and Metformin for DM and that he was taking Trulicity but has been out of it for over a month. Patient states that every month in order to get the Trulicity filled, he has to deal with the pharmacy and his PCP office multiple times and has to go in for a PCP visit just to get the paperwork filled out to try to get it filled each month. So he is very frustrated with the effort and time it takes to try to get it filled each month. Patient is using Dexcom G7 CGM and current CGM glucose was 199 during conversation. Patient states that his PCP has mentioned to him that he may need to be started on insulin  and he is willing to  take insulin  if needed. Discussed DKA and hospital protocol to treat with IV fluids and IV insulin  then transition to SQ insulin . Discussed that he may need long and short acting insulin  and patient states he would prefer to have both so one will keep glucose better controlled and the other could get his glucose down quickly if he is running high.  Educated patient on insulin  pen use at home. Reviewed all steps of insulin  pen including attachment of needle, 2-unit air shot, dialing up dose, giving injection, removing needle, disposal of sharps, storage of unused insulin , disposal of insulin  etc. Patient able to provide successful return demonstration and states it is very similar to using the Trulicity pen so he feels comfortable with insulin  pen if he is discharged home on insulin .  Informed patient that I would have our TOC pharmacy to check and see which insulins are covered and what copay would be; will also check to see if there is a GLP-1 medication that is preferred with his insurance plan. Stressed importance of getting DM controlled and keeping it controlled to prevent complications from uncontrolled DM. Patient appreciative of information and visit and has no questions at this time.  Thanks, Earnie Gainer, RN, MSN, CDCES Diabetes Coordinator Inpatient Diabetes Program [phone] (Team Pager from  8am to 5pm)

## 2024-08-07 NOTE — ED Notes (Addendum)
 Pt arrives to ED with c/o hyperglycemia. Pt reports that around 2:30 this morning, he started feeling bad and checked his dexcom and his CBG was around 300. Pt reports having increased thirst, sweating and feeling shaky. Pt reports that he does not feel well and feels like everything is a fog. Pt c/o  headache and rates pain 7/10. Pt reports feeling SOB and WOB is increased. Pt A&Ox4.

## 2024-08-07 NOTE — Hospital Course (Addendum)
 Jesse Kelly. is a 43 y.o. year old male with past medical history of type 2 diabetes mellitus, hypertension, hyperlipidemia, and morbid obesity.  He presents to [facility] ED with several days of polyuria and polydipsia.  He reports earlier this morning having a feeling of being foggy headed and notes this happened once in the past when his blood sugar was high.  He denies recent infection/illness, fever.  He reports overall compliance with his metformin  and glipizide , but notes that he stopped Trulicity approximately 1 month ago due to insurance coverage issues.  Reports that he wears a continuous glucose monitor and he has noticed in the last couple days his blood sugar has elevated up to the 340-380 range when they typically run between 130 -50.   ED Course: On arrival to [facility] regional ED patient was noted to be afebrile temp 37.1 C, BP 174/102, HR 116, RR 20, SpO2 94% on room air.  Labs notable for Glucose 345, CO2 20, BUN 35, creatinine 2.34, and anion gap of 19.  Osmolality 305 and beta hydroxybutyric acid 1.35 with pH of 7.37 on VBG.  He was started on IV insulin  via Endo tool for DKA and ordered 20 mEq of IV K. TRH contacted for admission.

## 2024-08-07 NOTE — Inpatient Diabetes Management (Signed)
 Inpatient Diabetes Program Recommendations  AACE/ADA: New Consensus Statement on Inpatient Glycemic Control (2015)  Target Ranges:  Prepandial:   less than 140 mg/dL      Peak postprandial:   less than 180 mg/dL (1-2 hours)      Critically ill patients:  140 - 180 mg/dL   Lab Results  Component Value Date   GLUCAP 326 (H) 08/07/2024    Review of Glycemic Control  Latest Reference Range & Units 08/07/24 08:31 08/07/24 09:51  Glucose-Capillary 70 - 99 mg/dL 642 (H) 673 (H)   Diabetes history: DM 2 (last A1C was 8.5%) Outpatient Diabetes medications:  Metformin 1000 mg bid, Glipizide 5 mg XL daily Current orders for Inpatient glycemic control:  None yet  Inpatient Diabetes Program Recommendations:    Spoke to patient by phone regarding increases in blood sugars last night and overnight.  He states that he was drinking lots of water and took his oral medications.  Of note, patient has not taken Trulicity in over a month due to problems with insurance. He states that blood sugars have never gone up like this and he feels really poorly. Talked to patient regarding potential need for insulin  at discharge.  He is agreeable and states that his MD has told him in the past that he may need insulin .  Per ED MD, Dr. Dicky, patient will be admitted.  Will follow-up on insulin  teaching once admitted.  He does wear Dexcom sensor and also has an understanding of injections from taking the Trulicity.  Will follow.   Thanks,  Randall Bullocks, RN, BC-ADM Inpatient Diabetes Coordinator Pager [phone]  (8a-5p)

## 2024-08-07 NOTE — ED Triage Notes (Signed)
 Pt to ED for hyperglycemia, states does not feel well. Reports CBG in 300s. Takes metformin

## 2024-08-07 NOTE — ED Notes (Signed)
 Called CCMD to add pt to monitoring.

## 2024-08-07 NOTE — ED Notes (Signed)
 Called lab to add Troponin to blood work already sent and was told it would be added.

## 2024-08-07 NOTE — Assessment & Plan Note (Signed)
-   Hold home Benicar - IV fluid hydration -Avoid nephrotoxins, contrast Dyes, Hypotension and Dehydration  - Repeat metabolic panel with a.m. labs

## 2024-08-07 NOTE — Telephone Encounter (Signed)
 Patient Product/process Development Scientist completed.    The patient is insured through [employer]. Patient has [employer], may use a copay card, and/or apply for patient assistance if available.    Ran test claim for Basaglar  Kwikpen and the current 30 day co-pay is $25.00.  Ran test claim for Novolog  Flexpen and the current 30 day co-pay is $25.00.  Ran test claim for Dexcom G7 Sensors and the current 30 day co-pay is $35.00.  Ran test claim for [employer] 3 Plus Sensors and Non Formulary   This test claim was processed through [employer]- copay amounts may vary at other pharmacies due to [employer], or as the patient moves through the different stages of their insurance plan.     Reyes Sharps, CPHT Pharmacy Technician Patient Advocate Specialist Lead [facility] Patient Advocate Team Direct Number: [phone]  Fax: [phone]

## 2024-08-07 NOTE — ED Provider Notes (Signed)
 [facility] Provider Note    Event Date/Time   First MD Initiated Contact with Patient 08/07/24 [phone]     (approximate)   History   Hyperglycemia   HPI  Jesse Kelly. is a 43 y.o. male  Type 2 diabetic, per Dr. Glover last note Metformin 1000 mg twice a day and Glipizide once a day. -At that time plan was to start Trulicity  Over the last several days had frequent urination, then in the last 2 days feeling frequently if thirsty.  Today started to feel little bit slightly foggy headed feeling headache.  Reports this happen once in the past when his blood sugar was out of control.  Feels very similar.  No other recent or preceding illnesses no fevers.  He reports he has been drinking water work last night, ate steak when he got home and asparagus, he has not had anything to eat or drink this morning to take his medicine today but reports compliance with his metformin and glipizide and Trulicity.  Wears a glucose monitor and he has noticed in the last couple days his blood sugar has elevated up to the 340-380 range along with the symptoms.       Physical Exam   Triage Vital Signs: ED Triage Vitals  Encounter Vitals Group     BP 08/07/24 0825 (!) 174/102     Girls Systolic BP Percentile --      Girls Diastolic BP Percentile --      Boys Systolic BP Percentile --      Boys Diastolic BP Percentile --      Pulse Rate 08/07/24 0824 (!) 116     Resp 08/07/24 0824 20     Temp 08/07/24 0824 98.8 F (37.1 C)     Temp src --      SpO2 08/07/24 0825 94 %     Weight 08/07/24 0824 (!) 330 lb (149.7 kg)     Height 08/07/24 0824 5' 11 (1.803 m)     Head Circumference --      Peak Flow --      Pain Score 08/07/24 0824 0     Pain Loc --      Pain Education --      Exclude from Growth Chart --     Most recent vital signs: Vitals:   08/07/24 0900 08/07/24 0930  BP: (!) 162/110 (!) 149/103  Pulse: (!) 110 100  Resp: 17 16  Temp:    SpO2: 100%  100%     General: Awake, no distress.  Pleasant, appears fatigued though.  Mucous membranes are dry.  Fully alert well-oriented.  Reports headache, he reports having same type of headache when his blood sugar was up once in the past as well.  He also feels occasionally a foggy type feeling.  Presently alert and well-oriented CV:  Good peripheral perfusion.  Normal tone except slight tachycardia Resp:  Normal effort.  Clear Abd:  No distention.  Obese but nontender Other:  Findings in the lower extremities of mild venous congestive type skin findings but no unilateral edema, no skin breakdown no evidence of cellulitic change.  Denies any pain or burning with urination but reports frequent urination   ED Results / Procedures / Treatments   Labs (all labs ordered are listed, but only abnormal results are displayed) Labs Reviewed  BASIC METABOLIC PANEL WITH GFR - Abnormal; Notable for the following components:      Result Value  Chloride 96 (*)    CO2 20 (*)    Glucose, Bld 355 (*)    BUN 35 (*)    Creatinine, Ser 2.34 (*)    GFR, Estimated 34 (*)    Anion gap 19 (*)    All other components within normal limits  CBC - Abnormal; Notable for the following components:   RBC 5.96 (*)    Hemoglobin 17.6 (*)    All other components within normal limits  OSMOLALITY - Abnormal; Notable for the following components:   Osmolality 305 (*)    All other components within normal limits  BETA-HYDROXYBUTYRIC ACID - Abnormal; Notable for the following components:   Beta-Hydroxybutyric Acid 1.35 (*)    All other components within normal limits  BLOOD GAS, VENOUS - Abnormal; Notable for the following components:   Bicarbonate 32.4 (*)    Acid-Base Excess 5.5 (*)    All other components within normal limits  CBG MONITORING, ED - Abnormal; Notable for the following components:   Glucose-Capillary 357 (*)    All other components within normal limits  CBG MONITORING, ED - Abnormal; Notable for the  following components:   Glucose-Capillary 326 (*)    All other components within normal limits  URINALYSIS, ROUTINE W REFLEX MICROSCOPIC  BASIC METABOLIC PANEL WITH GFR  BASIC METABOLIC PANEL WITH GFR  BASIC METABOLIC PANEL WITH GFR  BASIC METABOLIC PANEL WITH GFR  BETA-HYDROXYBUTYRIC ACID  BETA-HYDROXYBUTYRIC ACID  BETA-HYDROXYBUTYRIC ACID  BETA-HYDROXYBUTYRIC ACID  CBG MONITORING, ED  CBG MONITORING, ED  CBG MONITORING, ED  CBG MONITORING, ED  CBG MONITORING, ED  CBG MONITORING, ED  CBG MONITORING, ED  CBG MONITORING, ED  CBG MONITORING, ED  CBG MONITORING, ED    RADIOLOGY     PROCEDURES:  Critical Care performed: Yes, see critical care procedure note(s)  CRITICAL CARE Performed by: Oneil Budge   Total critical care time: 35 minutes  Critical care time was exclusive of separately billable procedures and treating other patients.  Critical care was necessary to treat or prevent imminent or life-threatening deterioration.  Critical care was time spent personally by me on the following activities: development of treatment plan with patient and/or surrogate as well as nursing, discussions with consultants, evaluation of patient's response to treatment, examination of patient, obtaining history from patient or surrogate, ordering and performing treatments and interventions, ordering and review of laboratory studies, ordering and review of radiographic studies, pulse oximetry and re-evaluation of patient's condition.   Procedures   MEDICATIONS ORDERED IN ED: Medications  lactated ringers  bolus 2,994 mL (has no administration in time range)  insulin  regular, human (MYXREDLIN ) 100 units/ 100 mL infusion (has no administration in time range)  lactated ringers  infusion (has no administration in time range)  dextrose  5 % in lactated ringers  infusion (has no administration in time range)  dextrose  50 % solution 0-50 mL (has no administration in time range)  potassium  chloride 10 mEq in 100 mL IVPB (has no administration in time range)  sodium chloride  0.9 % bolus 1,500 mL (1,500 mLs Intravenous New Bag/Given 08/07/24 0954)     IMPRESSION / MDM / ASSESSMENT AND PLAN / ED COURSE  I reviewed the triage vital signs and the nursing notes.                              Differential diagnosis includes, but is not limited to, hyperglycemia, he is a type II diabetic does report  a at least associated headache polyuria polydipsia.  Reports compliance with medications.  Neurologically intact without acute deficits moves all extremities extraocular movements are normal facial expressions speech clear speech he reports same headache in the past once when his blood sugar was high.  I suspect he is symptomatic of hypoglycemia he reports that he had sort of foggy type feeling associated as well earlier.  He is now alert and well-oriented.  With his blood sugar in the 360-ish range it seems unlikely he would have HHS or DKA, but we will certainly await screening labs and panel.  Serum awesome's etc.  No history of congestive heart failure.  Will initiate IV fluids, hydration, follow glucoses await chemistry panel.  Have also placed consult to diabetes coordinator for additional assistance if we are able to get his blood sugars well-controlled and his metabolic findings are reassuring with improvement in symptoms may be able to go home with adjustment to his regimen.  At this point he does not have any symptoms suggestive of another disease or process that caused his hyperglycemia and he reports compliance    Patient's presentation is most consistent with acute presentation with potential threat to life or bodily function.   The patient is on the cardiac monitor to evaluate for evidence of arrhythmia and/or significant heart rate changes.   Clinical Course as of 08/07/24 1042  Thu Aug 07, 2024  1027 Labs reviewed elevated anion gap with elevated beta hydroxy.  His pH is  currently normal which is somewhat reassuring but findings are concerning for acidosis and early DKA.  Consult with our hospitalist Dr. Paudel, and we will initiate Endo tool/insulin  drip.  Patient also with significant AKI receiving hydration.  Will continue to be monitored closely and obviously require admission for treatment of metabolic abnormalities, AKI, hyperglycemia etc. [MQ]  1040 I consulted with hospitalist, patient accepted.  Patient understanding agreeable with plan for admission.  He reports a headache and feeling of fogginess are improving quite a bit at this time.  Initiating insulin  drip as recommended by hospitalist service will continue to manage moving forward [MQ]    Clinical Course User Index [MQ] Dicky Anes, MD     FINAL CLINICAL IMPRESSION(S) / ED DIAGNOSES   Final diagnoses:  Type 2 diabetes mellitus with hyperglycemia, without long-term current use of insulin  (HCC)  DKA, type 2, not at goal [facility])  AKI (acute kidney injury)     Rx / DC Orders   ED Discharge Orders     None        Note:  This document was prepared using Dragon voice recognition software and may include unintentional dictation errors.   Dicky Anes, MD 08/07/24 [phone]

## 2024-08-07 NOTE — Assessment & Plan Note (Signed)
-   Hold home Benicar in setting of AKI -As needed hydralazine

## 2024-08-07 NOTE — H&P (Addendum)
 History and Physical    Jesse Kelly. FMW:[id number] DOB: 29-Jan-1981 DOA: 08/07/2024  DOS: the patient was seen and examined on 08/07/2024  PCP: Jesse Idelia DELENA, Jesse Kelly   Patient coming from: Home  I have personally briefly reviewed patient's old medical records in [facility] and CareEverywhere  HPI:  Jesse Piechota. is a 43 y.o. year old male with past medical history of type 2 diabetes mellitus, hypertension, hyperlipidemia, and morbid obesity.  He presents to [facility] ED with several days of polyuria and polydipsia.  He reports earlier this morning having a feeling of being foggy headed and notes this happened once in the past when his blood sugar was high.  He denies recent infection/illness, fever.  He reports overall compliance with his metformin and glipizide, but notes that he stopped Trulicity approximately 1 month ago due to insurance coverage issues.  Reports that he wears a continuous glucose monitor and he has noticed in the last couple days his blood sugar has elevated up to the 340-380 range when they typically run between 130 -50.   ED Course: On arrival to [facility] ED patient was noted to be afebrile temp 37.1 C, BP 174/102, HR 116, RR 20, SpO2 94% on room air.  Labs notable for Glucose 345, CO2 20, BUN 35, creatinine 2.34, and anion gap of 19.  Osmolality 305 and beta hydroxybutyric acid 1.35 with pH of 7.37 on VBG.  He was started on IV insulin  via Endo tool for DKA and ordered 20 mEq of IV K. TRH contacted for admission.    Review of Systems:   Review of Systems  Constitutional:  Negative for chills, fever and malaise/fatigue.  HENT:  Negative for congestion.   Eyes:  Positive for blurred vision. Negative for pain.  Respiratory:  Negative for cough and shortness of breath.   Cardiovascular:  Negative for chest pain and palpitations.  Gastrointestinal:  Negative for abdominal pain, nausea and vomiting.  Genitourinary:  Positive  for frequency.  Neurological:  Positive for headaches.  Endo/Heme/Allergies:  Positive for polydipsia.  Psychiatric/Behavioral:  The patient is nervous/anxious.     Past Medical History:  Diagnosis Date   Diabetes mellitus without complication (HCC)    Hyperlipidemia    Hypertension    Morbid obesity (HCC)     History reviewed. No pertinent surgical history.   reports that he has been smoking cigars. He has never used smokeless tobacco. He reports that he does not drink alcohol and does not use drugs.  Allergies[1]  History reviewed. No pertinent family history.  Prior to Admission medications  Medication Sig Start Date End Date Taking? Authorizing Provider  ibuprofen  (MOTRIN  IB) 200 MG tablet Take 3 tablets (600 mg total) by mouth every 6 (six) hours as needed. 12/17/16   Jesse Lynwood DELENA, Jesse Kelly  ondansetron  (ZOFRAN ) 4 MG tablet Take 1 tablet (4 mg total) by mouth every 8 (eight) hours as needed for nausea or vomiting. 12/17/16   Jesse Lynwood DELENA, Jesse Kelly  oxyCODONE -acetaminophen  (ROXICET) 5-325 MG tablet Take 1 tablet by mouth every 6 (six) hours as needed. 12/17/16   Jesse Lynwood DELENA, Jesse Kelly  tamsulosin  (FLOMAX ) 0.4 MG CAPS capsule Take 1 capsule (0.4 mg total) by mouth daily. 12/17/16   Jesse Lynwood DELENA, Jesse Kelly    Physical Exam: Vitals:   08/07/24 0930 08/07/24 1110 08/07/24 1135 08/07/24 1213  BP: (!) 149/103 (!) 179/105 (!) 172/94   Pulse: 100 (!) 101 89   Resp: 16  17 20   Temp:    98.2 F (36.8 C)  TempSrc:    Oral  SpO2: 100% 99% 100%   Weight:      Height:        Physical Exam Vitals and nursing note reviewed.  HENT:     Head: Normocephalic.  Eyes:     Pupils: Pupils are equal, round, and reactive to light.  Cardiovascular:     Rate and Rhythm: Normal rate and regular rhythm.     Pulses: Normal pulses.     Heart sounds: Normal heart sounds.  Pulmonary:     Effort: Pulmonary effort is normal.     Breath sounds: Normal breath sounds.  Abdominal:     Palpations: Abdomen is  soft.     Tenderness: There is no abdominal tenderness. There is no guarding.  Skin:    General: Skin is warm and dry.     Capillary Refill: Capillary refill takes less than 2 seconds.  Neurological:     General: No focal deficit present.     Mental Status: He is alert.  Psychiatric:        Mood and Affect: Mood normal.        Behavior: Behavior normal.     Labs on Admission: I have personally reviewed following labs and imaging studies  CBC: Recent Labs  Lab 08/07/24 0826  WBC 9.7  HGB 17.6*  HCT 50.5  MCV 84.7  PLT 176   Basic Metabolic Panel: Recent Labs  Lab 08/07/24 0826  NA 135  K 3.7  CL 96*  CO2 20*  GLUCOSE 355*  BUN 35*  CREATININE 2.34*  CALCIUM 9.6   GFR: Estimated Creatinine Clearance: 59.3 mL/min (A) (by C-G formula based on SCr of 2.34 mg/dL (H)). Liver Function Tests: No results for input(s): AST, ALT, ALKPHOS, BILITOT, PROT, ALBUMIN in the last 168 hours. No results for input(s): LIPASE, AMYLASE in the last 168 hours. No results for input(s): AMMONIA in the last 168 hours. Coagulation Profile: No results for input(s): INR, PROTIME in the last 168 hours. Cardiac Enzymes: No results for input(s): CKTOTAL, CKMB, CKMBINDEX, TROPONINI, TROPONINIHS in the last 168 hours. BNP (last 3 results) No results for input(s): BNP in the last 8760 hours. HbA1C: No results for input(s): HGBA1C in the last 72 hours. CBG: Recent Labs  Lab 08/07/24 0831 08/07/24 0951 08/07/24 1121 08/07/24 1227  GLUCAP 357* 326* 282* 246*   Lipid Profile: No results for input(s): CHOL, HDL, LDLCALC, TRIG, CHOLHDL, LDLDIRECT in the last 72 hours. Thyroid Function Tests: No results for input(s): TSH, T4TOTAL, FREET4, T3FREE, THYROIDAB in the last 72 hours. Anemia Panel: No results for input(s): VITAMINB12, FOLATE, FERRITIN, TIBC, IRON, RETICCTPCT in the last 72 hours. Urine analysis:    Component Value  Date/Time   COLORURINE AMBER (A) 12/17/2016 1418   APPEARANCEUR CLOUDY (A) 12/17/2016 1418   LABSPEC 1.026 12/17/2016 1418   PHURINE 5.0 12/17/2016 1418   GLUCOSEU NEGATIVE 12/17/2016 1418   HGBUR LARGE (A) 12/17/2016 1418   BILIRUBINUR NEGATIVE 12/17/2016 1418   KETONESUR NEGATIVE 12/17/2016 1418   PROTEINUR 100 (A) 12/17/2016 1418   NITRITE NEGATIVE 12/17/2016 1418   LEUKOCYTESUR NEGATIVE 12/17/2016 1418    Radiological Exams on Admission: I have personally reviewed images No results found.    Assessment/Plan Principal Problem:   DKA (diabetic ketoacidosis) (HCC) Active Problems:   AKI (acute kidney injury)   Essential hypertension   Hyperlipidemia   Obesity, Class III, BMI 40-49.9 (morbid obesity) (  HCC)    Assessment and Plan: * DKA (diabetic ketoacidosis) (HCC) Glucose 355, pH 7.37 on VBG, and Beta-hydroxybutryic acid 1.35 concerning for early DKA - IV Insulin  per Endo tool - CBG as directed per Endo tool - Trend BMP q4H and Beta-hydroxybutyric acid q8H - Replete K PRN - Consult Diabetes Coordinator   AKI (acute kidney injury) - Hold home Benicar - IV fluid hydration -Avoid nephrotoxins, contrast Dyes, Hypotension and Dehydration  - Repeat metabolic panel with a.m. labs   Essential hypertension - Hold home Benicar in setting of AKI -As needed hydralazine   Hyperlipidemia - Hold home atorvastatin in setting of AKI and likely DKA associated volume depletion  Obesity, Class III, BMI 40-49.9 (morbid obesity) (HCC) Meets criteria for morbid obesity based on the presence of 1 or more chronic comorbidities.  Patient has type 2 diabetes mellitus. This complicates overall care and prognosis.  - Patient counseled on importance of lifestyle changes and healthy diet  Addendum Patient complained of some chest discomfort.  Will get EKG, troponin, give him Maalox and check BNP.  VTE prophylaxis:  Lovenox   GI prophylaxis: Not indicated  Diet: NPO Access: PIV Lines:  None Code Status:  Full Code Telemetry: Yes, Stepdown status  Admission status: Observation, Step Down Unit Patient is from: Home  Anticipated d/c is to: Home  Anticipated d/c date is: 1 day Patient currently: Receiving IV insulin  pending improvement in beta-hydroxybutryic acid and anion gap   Family Communication: Wife updated at bedside  Consults called: N/A    Severity of Illness: The appropriate patient status for this patient is OBSERVATION. Observation status is judged to be reasonable and necessary in order to provide the required intensity of service to ensure the patient's safety. The patient's presenting symptoms, physical exam findings, and initial radiographic and laboratory data in the context of their medical condition is felt to place them at decreased risk for further clinical deterioration. Furthermore, it is anticipated that the patient will be medically stable for discharge from the hospital within 2 midnights of admission.   To reach the provider On-Call:   7AM- 7PM see care teams to locate the attending and reach out to them via [email or web address] Password: [email or web address] 7PM-7AM contact night-coverage If you still have difficulty reaching the appropriate provider, please page the [facility] (Director on Call) for [facility] on amion for assistance  This document was prepared using Conservation officer, historic buildings and may include unintentional dictation errors.  Rockie Rams FNP-BC, PMHNP-BC Nurse Practitioner [facility] [place]      [1] No Known Allergies

## 2024-08-07 NOTE — Progress Notes (Signed)
° °  Brief Progress Note   _____________________________________________________________________________________________________________  Patient Name: Jesse DELENA Delena Kelly. Patient DOB: 1981-04-28 Date: @TODAY @      Data: Reviewed vital signs, labs, and clinical notes.    Action: No action required at this time.    Response:  On Insulin  gtt/ Endo tool  _____________________________________________________________________________________________________________  [facility] RN Expeditor Alie Moudy S Crawford Tamura Please contact us  directly via secure chat (search for [facility]) or by calling us  at [phone] [facility]).

## 2024-08-07 NOTE — Assessment & Plan Note (Signed)
 Meets criteria for morbid obesity based on the presence of 1 or more chronic comorbidities.  Patient has type 2 diabetes mellitus. This complicates overall care and prognosis.  - Patient counseled on importance of lifestyle changes and healthy diet

## 2024-08-07 NOTE — Assessment & Plan Note (Addendum)
-   Hold home atorvastatin  in setting of AKI and likely DKA associated volume depletion

## 2024-08-07 NOTE — ED Notes (Signed)
 Called lab to check on status of Troponin, and was told it would be added on to prior collected lab.

## 2024-08-07 NOTE — Assessment & Plan Note (Addendum)
 Glucose 355, pH 7.37 on VBG, and Beta-hydroxybutryic acid 1.35 concerning for early DKA - IV Insulin  per Endo tool - CBG as directed per Endo tool - Trend BMP q4H and Beta-hydroxybutyric acid q8H - Replete K PRN - Consult Diabetes Coordinator

## 2024-08-07 NOTE — Progress Notes (Signed)
 PHARMACIST - PHYSICIAN COMMUNICATION  CONCERNING:  Enoxaparin  (Lovenox ) for DVT Prophylaxis    RECOMMENDATION: Patient was prescribed enoxaprin 40mg  q24 hours for VTE prophylaxis.   Filed Weights   08/07/24 0824  Weight: (!) 149.7 kg (330 lb)    Body mass index is 46.03 kg/m.  Estimated Creatinine Clearance: 59.3 mL/min (A) (by C-G formula based on SCr of 2.34 mg/dL (H)).   Based on [facility] policy patient is candidate for enoxaparin  0.5mg /kg TBW SQ every 24 hours based on BMI being >30.  DESCRIPTION: Pharmacy has adjusted enoxaparin  dose per [facility] policy.  Patient is now receiving enoxaparin  75 mg every 24 hours    Syncere Eble Rodriguez-Guzman PharmD, BCPS 08/07/2024 11:15 AM

## 2024-08-08 ENCOUNTER — Other Ambulatory Visit: Payer: Self-pay

## 2024-08-08 DIAGNOSIS — E1110 Type 2 diabetes mellitus with ketoacidosis without coma: Secondary | ICD-10-CM | POA: Diagnosis not present

## 2024-08-08 DIAGNOSIS — N179 Acute kidney failure, unspecified: Secondary | ICD-10-CM

## 2024-08-08 DIAGNOSIS — I10 Essential (primary) hypertension: Secondary | ICD-10-CM | POA: Diagnosis not present

## 2024-08-08 DIAGNOSIS — E785 Hyperlipidemia, unspecified: Secondary | ICD-10-CM

## 2024-08-08 LAB — GLUCOSE, CAPILLARY: Glucose-Capillary: 196 mg/dL — ABNORMAL HIGH (ref 70–99)

## 2024-08-08 LAB — BETA-HYDROXYBUTYRIC ACID
Beta-Hydroxybutyric Acid: 0.52 mmol/L — ABNORMAL HIGH (ref 0.05–0.27)
Beta-Hydroxybutyric Acid: 0.77 mmol/L — ABNORMAL HIGH (ref 0.05–0.27)

## 2024-08-08 LAB — BASIC METABOLIC PANEL WITH GFR
Anion gap: 12 (ref 5–15)
Anion gap: 14 (ref 5–15)
Anion gap: 14 (ref 5–15)
BUN: 21 mg/dL — ABNORMAL HIGH (ref 6–20)
BUN: 18 mg/dL (ref 6–20)
BUN: 18 mg/dL (ref 6–20)
CO2: 26 mmol/L (ref 22–32)
CO2: 24 mmol/L (ref 22–32)
CO2: 26 mmol/L (ref 22–32)
Calcium: 8.8 mg/dL — ABNORMAL LOW (ref 8.9–10.3)
Calcium: 8.7 mg/dL — ABNORMAL LOW (ref 8.9–10.3)
Calcium: 8.7 mg/dL — ABNORMAL LOW (ref 8.9–10.3)
Chloride: 100 mmol/L (ref 98–111)
Chloride: 100 mmol/L (ref 98–111)
Chloride: 100 mmol/L (ref 98–111)
Creatinine, Ser: 1.22 mg/dL (ref 0.61–1.24)
Creatinine, Ser: 1.12 mg/dL (ref 0.61–1.24)
Creatinine, Ser: 1.14 mg/dL (ref 0.61–1.24)
GFR, Estimated: >60 mL/min
GFR, Estimated: >60 mL/min
GFR, Estimated: >60 mL/min
Glucose, Bld: 165 mg/dL — ABNORMAL HIGH (ref 70–99)
Glucose, Bld: 162 mg/dL — ABNORMAL HIGH (ref 70–99)
Glucose, Bld: 162 mg/dL — ABNORMAL HIGH (ref 70–99)
Potassium: 3.1 mmol/L — ABNORMAL LOW (ref 3.5–5.1)
Potassium: 3.1 mmol/L — ABNORMAL LOW (ref 3.5–5.1)
Potassium: 3.1 mmol/L — ABNORMAL LOW (ref 3.5–5.1)
Sodium: 138 mmol/L (ref 135–145)
Sodium: 138 mmol/L (ref 135–145)
Sodium: 140 mmol/L (ref 135–145)

## 2024-08-08 LAB — CBC
HCT: 47.8 % (ref 39.0–52.0)
Hemoglobin: 16 g/dL (ref 13.0–17.0)
MCH: 29.3 pg (ref 26.0–34.0)
MCHC: 33.5 g/dL (ref 30.0–36.0)
MCV: 87.5 fL (ref 80.0–100.0)
Platelets: 135 K/uL — ABNORMAL LOW (ref 150–400)
RBC: 5.46 MIL/uL (ref 4.22–5.81)
RDW: 12.9 % (ref 11.5–15.5)
WBC: 6.4 K/uL (ref 4.0–10.5)
nRBC: 0 % (ref 0.0–0.2)

## 2024-08-08 LAB — HIV ANTIBODY (ROUTINE TESTING W REFLEX): HIV Screen 4th Generation wRfx: NONREACTIVE

## 2024-08-08 LAB — HEMOGLOBIN A1C
Hgb A1c MFr Bld: 11 % — ABNORMAL HIGH (ref 4.8–5.6)
Mean Plasma Glucose: 269 mg/dL

## 2024-08-08 LAB — TROPONIN T, HIGH SENSITIVITY: Troponin T High Sensitivity: <15 ng/L (ref 0–19)

## 2024-08-08 MED ORDER — INSULIN PEN NEEDLE 32G X 4 MM MISC
1.0000 | Freq: Three times a day (TID) | 1 refills | Status: AC
  Filled 2024-08-08: qty 100, 33d supply, fill #0

## 2024-08-08 MED ORDER — BASAGLAR KWIKPEN 100 UNIT/ML ~~LOC~~ SOPN
15.0000 [IU] | PEN_INJECTOR | Freq: Every day | SUBCUTANEOUS | 1 refills | Status: AC
  Filled 2024-08-08: qty 15, 100d supply, fill #0

## 2024-08-08 MED ORDER — IRBESARTAN 75 MG PO TABS
37.5000 mg | ORAL_TABLET | Freq: Every day | ORAL | Status: DC
  Administered 2024-08-08: 37.5 mg via ORAL
  Filled 2024-08-08: qty 0.5

## 2024-08-08 MED ORDER — MONTELUKAST SODIUM 10 MG PO TABS: 10.0000 mg | ORAL_TABLET | Freq: Every day | ORAL | Status: DC

## 2024-08-08 MED ORDER — INSULIN GLARGINE 100 UNIT/ML ~~LOC~~ SOLN
15.0000 [IU] | Freq: Every day | SUBCUTANEOUS | Status: DC
  Administered 2024-08-08: 15 [IU] via SUBCUTANEOUS
  Filled 2024-08-08: qty 0.15

## 2024-08-08 MED ORDER — ARMODAFINIL 150 MG PO TABS: 150.0000 mg | ORAL_TABLET | Freq: Every day | ORAL | Status: DC

## 2024-08-08 MED ORDER — GLIPIZIDE ER 5 MG PO TB24
5.0000 mg | ORAL_TABLET | Freq: Every day | ORAL | Status: DC
  Filled 2024-08-08: qty 1

## 2024-08-08 MED ORDER — GLIPIZIDE ER 10 MG PO TB24
10.0000 mg | ORAL_TABLET | Freq: Every day | ORAL | Status: DC
  Administered 2024-08-08: 10 mg via ORAL
  Filled 2024-08-08: qty 1

## 2024-08-08 MED ORDER — METFORMIN HCL 500 MG PO TABS
1000.0000 mg | ORAL_TABLET | Freq: Two times a day (BID) | ORAL | Status: DC
  Administered 2024-08-08: 1000 mg via ORAL
  Filled 2024-08-08: qty 2

## 2024-08-08 MED ORDER — GLIPIZIDE ER 10 MG PO TB24
10.0000 mg | ORAL_TABLET | Freq: Every day | ORAL | 1 refills | Status: AC
  Filled 2024-08-08: qty 30, 30d supply, fill #0

## 2024-08-08 MED ORDER — POTASSIUM CHLORIDE CRYS ER 20 MEQ PO TBCR
40.0000 meq | EXTENDED_RELEASE_TABLET | Freq: Once | ORAL | Status: AC
  Administered 2024-08-08: 40 meq via ORAL
  Filled 2024-08-08: qty 2

## 2024-08-08 MED ORDER — ATORVASTATIN CALCIUM 20 MG PO TABS: 20.0000 mg | ORAL_TABLET | Freq: Every day | ORAL | Status: DC

## 2024-08-08 NOTE — Discharge Summary (Signed)
 " Physician Discharge Summary   Patient: Jesse Kelly. MRN: [id number] DOB: 02-07-1981  Admit date:     08/07/2024  Discharge date: 08/08/2024  Discharge Physician: Leita Blanch   PCP: Glover Lenis, MD   Recommendations at discharge:   patient to keep log of sugars at home discussed with Dr. Malva in order to help adjust insulin  dose   Discharge Diagnoses: Principal Problem:   DKA (diabetic ketoacidosis) (HCC) Active Problems:   AKI (acute kidney injury)   Essential hypertension   Hyperlipidemia   Obesity, Class III, BMI 40-49.9 (morbid obesity) (HCC)  Jesse Kelly. is a 43 y.o. year old male with past medical history of type 2 diabetes mellitus, hypertension, hyperlipidemia, and morbid obesity. He presents to [facility] ED with several days of polyuria and polydipsia.  He reports overall compliance with his metformin  and glipizide , but notes that he stopped Trulicity approximately 1 month ago due to insurance coverage issues. Reports that he wears a continuous glucose monitor and he has noticed in the last couple days his blood sugar has elevated up to the 340-380 range   DKA in the setting of type II diabetes, mild resolved -- patient was placed on IV insulin  drip per Endo tool. Converted rather quickly. Switch to long-acting insulin . -- Diabetes coordinator input appreciated -- received IV fluids -- now started on insulin  Lantus  15 units daily. Patient will keep tab on sugars and discussed with PCP for chest -- resume glipizide  XL and metformin .  -- Diet and exercise discussed  AK I in the setting of DKA -- creatinine improved. Resumed ACE inhibitor -- received IV fluids  Hypertension -- resumed Benicar  Hyperlipidemia -- can't statins  Obesity, Class III, BMI 40-49.9 (morbid obesity) (HCC) Meets criteria for morbid obesity based on the presence of 1 or more chronic comorbidities.  Patient has type 2 diabetes mellitus. This complicates  overall care and prognosis.  - Patient counseled on importance of lifestyle changes and healthy diet  Overall remained hemodynamically stable. Discharge plan discussed with patient and family at bedside.        Disposition: Home Diet recommendation:  Cardiac and Carb modified diet DISCHARGE MEDICATION: Allergies as of 08/08/2024       Reactions   Iodinated Contrast Media Hives   Patient states can tolerate with pretreatment of Bendryl   Dog Epithelium (canis Lupus Familiaris) Other (See Comments)   Wheezing, sob, and  cough.        Medication List     TAKE these medications    Armodafinil  150 MG tablet Take 150 mg by mouth daily.   atorvastatin  20 MG tablet Commonly known as: LIPITOR Take 20 mg by mouth at bedtime.   Basaglar  KwikPen 100 UNIT/ML Inject 15 Units into the skin daily.   glipiZIDE  10 MG 24 hr tablet Commonly known as: GLUCOTROL  XL Take 1 tablet (10 mg total) by mouth daily. What changed:  medication strength how much to take   metFORMIN  1000 MG tablet Commonly known as: GLUCOPHAGE  Take 1,000 mg by mouth 2 (two) times daily.   montelukast  10 MG tablet Commonly known as: SINGULAIR  Take 10 mg by mouth at bedtime.   olmesartan 40 MG tablet Commonly known as: BENICAR Take 40 mg by mouth daily.        Follow-up Information     Glover Lenis, MD. Schedule an appointment as soon as possible for a visit in 2 week(s).   Specialty: Family Medicine Why: f/u DM-2 Contact information:  [address] [place] [address] [phone]                Discharge Exam: Filed Weights   08/07/24 0824  Weight: (!) 149.7 kg   Morbid obesity. Alert oriented times three. Respiratory clear to auscultation. Cardiovascular both heart sounds normal no murmur. Neuro- grossly nonfocal  Condition at discharge: fair  The results of significant diagnostics from this hospitalization (including imaging, microbiology, ancillary and laboratory) are  listed below for reference.   Imaging Studies: No results found.  Microbiology: No results found for this or any previous visit.  Labs: CBC: Recent Labs  Lab 08/07/24 0826 08/08/24 0605  WBC 9.7 6.4  HGB 17.6* 16.0  HCT 50.5 47.8  MCV 84.7 87.5  PLT 176 135*   Basic Metabolic Panel: Recent Labs  Lab 08/07/24 0826 08/07/24 1207 08/07/24 1511 08/08/24 0143 08/08/24 0605  NA 135 138 139 138 140  K 3.7 3.5 3.3* 3.1* 3.1*  CL 96* 100 101 100 100  CO2 20* 26 25 26 26   GLUCOSE 355* 236* 131* 165* 162*  BUN 35* 32* 28* 21* 18  CREATININE 2.34* 1.98* 1.69* 1.22 1.14  CALCIUM  9.6 8.8* 9.0 8.8* 8.7*   Liver Function Tests: No results for input(s): AST, ALT, ALKPHOS, BILITOT, PROT, ALBUMIN in the last 168 hours. CBG: Recent Labs  Lab 08/07/24 1540 08/07/24 1638 08/07/24 1747 08/07/24 2019 08/08/24 0843  GLUCAP 142* 133* 171* 163* 196*    Discharge time spent: greater than 30 minutes.  Signed: Leita Blanch, MD [facility] 08/08/2024 "

## 2024-08-08 NOTE — Plan of Care (Signed)

## 2024-08-08 NOTE — Progress Notes (Signed)
 Reviewed discharge instructions with patient. Patient acknowledged understanding. Patient received meds to bedside. Patient discharged with personal belongings. Patient wheeled out by staff. Patient transported home via personal vehicle.

## 2024-08-08 NOTE — Plan of Care (Signed)
  Problem: Education: Goal: Ability to describe self-care measures that may prevent or decrease complications (Diabetes Survival Skills Education) will improve Outcome: Progressing Goal: Individualized Educational Video(s) Outcome: Progressing   Problem: Fluid Volume: Goal: Ability to maintain a balanced intake and output will improve Outcome: Progressing   Problem: Health Behavior/Discharge Planning: Goal: Ability to identify and utilize available resources and services will improve Outcome: Progressing Goal: Ability to manage health-related needs will improve Outcome: Progressing

## 2024-08-08 NOTE — Inpatient Diabetes Management (Signed)
 Inpatient Diabetes Program Recommendations  AACE/ADA: New Consensus Statement on Inpatient Glycemic Control   Target Ranges:  Prepandial:   less than 140 mg/dL      Peak postprandial:   less than 180 mg/dL (1-2 hours)      Critically ill patients:  140 - 180 mg/dL    Latest Reference Range & Units 08/07/24 12:27 08/07/24 13:29 08/07/24 14:30 08/07/24 15:40 08/07/24 16:38 08/07/24 17:47 08/07/24 20:19 08/08/24 08:43  Glucose-Capillary 70 - 99 mg/dL 753 (H) 827 (H) 860 (H) 142 (H) 133 (H) 171 (H) 163 (H) 196 (H)    Latest Reference Range & Units 08/07/24 08:31 08/07/24 09:51 08/07/24 11:21  Glucose-Capillary 70 - 99 mg/dL 642 (H) 673 (H) 717 (H)    Latest Reference Range & Units 08/07/24 08:26 08/07/24 12:07 08/07/24 15:11 08/08/24 01:43 08/08/24 06:05  Beta-Hydroxybutyric Acid 0.05 - 0.27 mmol/L 1.35 (H)  0.10 0.52 (H)   Glucose 70 - 99 mg/dL 644 (H) 763 (H) 868 (H) 165 (H) 162 (H)  Hemoglobin A1C 4.8 - 5.6 %   11.0 (H)     Review of Glycemic Control  Diabetes history: DM 2 Outpatient Diabetes medications: Metformin  1000 mg BID, Glipizide  XL 5 mg daily, Trulicity (not taken in 1 month) Inpatient DM orders: Glipizide  XL 10 mg daily, Metformin  1000 mg BID, Novolog  0-9 units TID with meals, Novolog  0-5 units at bedtime, Lantus  15 units daily   Inpatient Diabetes Program Recommendations:    Outpatient DM: Per outpatient TOC pharmacy check, Basaglar  and Novolog  insulin  pens are preferred and Basaglar  Pen copay is $25.00, Novolog  Flexpen copay is $25.00, and Dexcom copay is $35.00.  At discharge if patient is discharged new to insulin , he will need Rx for Basaglar  pens (#[id number]), Novolog  pens (#[id number]), and insulin  pen needles [phone]).   HbgA1C:  A1C 11% on 08/07/24 indicating an average glucose of 269 mg/dl over the past 2-3 months.  NOTE: Spoke with patient over the phone to inform him that Basaglar  and Novolog  are the insulins covered by his insurance and they copay is $25 each. Patient  states that copay of $25 would be affordable for him.   Discussed Basaglar  and Novolog  and how each works (in case both prescribed at discharge). Reviewed current A1C of 11% indicating an average glucose of 269 mg/dl over the past 2-3 months. Encouraged patient to follow up with PCP in the near future regarding DM management.  Patient appreciative of information and has no questions at this time.  Thanks, Earnie Gainer, RN, MSN, CDCES Diabetes Coordinator Inpatient Diabetes Program [phone] (Team Pager from 8am to 5pm)

## 2024-08-08 NOTE — Discharge Instructions (Signed)
 Take your insulin  as directed. Review sugars with Dr Glover for adjusting insulin  dosing Diet and exercise as discussed. Goal is to lower A1c to 7.0%
